# Patient Record
Sex: Male | Born: 1975 | Race: White | Hispanic: No | Marital: Married | Smoking: Never smoker
Health system: Southern US, Community
[De-identification: ages and names within clinical notes are randomized; demographics above are authoritative.]

## PROBLEM LIST (undated history)

## (undated) DIAGNOSIS — H539 Unspecified visual disturbance: Secondary | ICD-10-CM

## (undated) DIAGNOSIS — T7840XA Allergy, unspecified, initial encounter: Secondary | ICD-10-CM

## (undated) HISTORY — DX: Unspecified visual disturbance: H53.9

## (undated) HISTORY — PX: NO PAST SURGERIES: SHX2092

## (undated) HISTORY — PX: WISDOM TOOTH EXTRACTION: SHX21

## (undated) HISTORY — DX: Allergy, unspecified, initial encounter: T78.40XA

---

## 2016-05-26 ENCOUNTER — Ambulatory Visit (INDEPENDENT_AMBULATORY_CARE_PROVIDER_SITE_OTHER): Payer: Managed Care, Other (non HMO) | Admitting: Physician Assistant

## 2016-05-26 ENCOUNTER — Encounter: Payer: Self-pay | Admitting: Physician Assistant

## 2016-05-26 VITALS — BP 130/80 | HR 75 | Temp 98.2°F | Resp 14 | Ht 68.75 in | Wt 169.0 lb

## 2016-05-26 DIAGNOSIS — R52 Pain, unspecified: Secondary | ICD-10-CM

## 2016-05-26 DIAGNOSIS — M67911 Unspecified disorder of synovium and tendon, right shoulder: Secondary | ICD-10-CM | POA: Diagnosis not present

## 2016-05-26 DIAGNOSIS — Z0000 Encounter for general adult medical examination without abnormal findings: Secondary | ICD-10-CM | POA: Diagnosis not present

## 2016-05-26 DIAGNOSIS — R531 Weakness: Secondary | ICD-10-CM | POA: Insufficient documentation

## 2016-05-26 DIAGNOSIS — R202 Paresthesia of skin: Secondary | ICD-10-CM | POA: Diagnosis not present

## 2016-05-26 HISTORY — DX: Pain, unspecified: R52

## 2016-05-26 HISTORY — DX: Encounter for general adult medical examination without abnormal findings: Z00.00

## 2016-05-26 HISTORY — DX: Paresthesia of skin: R20.2

## 2016-05-26 HISTORY — DX: Unspecified disorder of synovium and tendon, right shoulder: M67.911

## 2016-05-26 HISTORY — DX: Weakness: R53.1

## 2016-05-26 LAB — COMPREHENSIVE METABOLIC PANEL
ALBUMIN: 4.5 g/dL (ref 3.5–5.2)
ALT: 32 U/L (ref 0–53)
AST: 23 U/L (ref 0–37)
Alkaline Phosphatase: 59 U/L (ref 39–117)
BUN: 15 mg/dL (ref 6–23)
CHLORIDE: 102 meq/L (ref 96–112)
CO2: 32 mEq/L (ref 19–32)
CREATININE: 1 mg/dL (ref 0.40–1.50)
Calcium: 9.7 mg/dL (ref 8.4–10.5)
GFR: 88.55 mL/min (ref >60.00)
GLUCOSE: 93 mg/dL (ref 70–99)
Potassium: 4.5 mEq/L (ref 3.5–5.1)
Sodium: 139 mEq/L (ref 135–145)
Total Bilirubin: 0.9 mg/dL (ref 0.2–1.2)
Total Protein: 7.5 g/dL (ref 6.0–8.3)

## 2016-05-26 LAB — LIPID PANEL
CHOL/HDL RATIO: 5 ratio — AB (ref <5.0)
Cholesterol: 208 mg/dL — ABNORMAL HIGH (ref <200)
HDL: 42 mg/dL (ref >40)
LDL CALC: 143 mg/dL — AB (ref <100)
Triglycerides: 116 mg/dL (ref <150)
VLDL: 23 mg/dL (ref <30)

## 2016-05-26 LAB — CBC
HCT: 47.3 % (ref 39.0–52.0)
HEMOGLOBIN: 15.7 g/dL (ref 13.0–17.0)
MCHC: 33.1 g/dL (ref 30.0–36.0)
MCV: 90.5 fl (ref 78.0–100.0)
PLATELETS: 323 10*3/uL (ref 150.0–400.0)
RBC: 5.23 Mil/uL (ref 4.22–5.81)
RDW: 13.4 % (ref 11.5–15.5)
WBC: 6 10*3/uL (ref 4.0–10.5)

## 2016-05-26 LAB — URINALYSIS, ROUTINE W REFLEX MICROSCOPIC
BILIRUBIN URINE: NEGATIVE
HGB URINE DIPSTICK: NEGATIVE
Ketones, ur: NEGATIVE
LEUKOCYTES UA: NEGATIVE
NITRITE: NEGATIVE
Specific Gravity, Urine: 1.015 (ref 1.000–1.030)
Total Protein, Urine: NEGATIVE
UROBILINOGEN UA: 0.2 (ref 0.0–1.0)
Urine Glucose: NEGATIVE
pH: 7.5 (ref 5.0–8.0)

## 2016-05-26 LAB — TSH: TSH: 1.42 u[IU]/mL (ref 0.35–4.50)

## 2016-05-26 LAB — SEDIMENTATION RATE: SED RATE: 7 mm/h (ref 0–15)

## 2016-05-26 LAB — HEMOGLOBIN A1C: HEMOGLOBIN A1C: 5.6 % (ref 4.6–6.5)

## 2016-05-26 MED ORDER — NAPROXEN SODIUM 550 MG PO TABS: 550.0000 mg | ORAL_TABLET | Freq: Two times a day (BID) | ORAL | 0 refills | Status: DC

## 2016-05-26 NOTE — Assessment & Plan Note (Signed)
Concern for mild tear. Supportive measures reviewed. Rx Anaprox. Referral to Sports Medicine placed.

## 2016-05-26 NOTE — Progress Notes (Signed)
Patient presents to clinic today to establish care. Patient is requesting CPE today. Is fasting for labs. 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 Tries to avoid fast food when possible. Diet well-balanced when at home. Exercise -- is playing softball presently. Is a golfer. Body mass index is 25.14 kg/m. Has gained 10 pounds since move to [employer].  Acute Concerns: Endorses long-standing history (> 10 years) of joint aches, specifically in the lower legs/ankles and feet bilaterally.Worsening over the past year. Aching most of the time, occasionally sharp/stabbing pains. Pain is worsened with ambulation.  Endorses significant, prolonged stiffness in the mornings. Also endorses stiffness/pain of hands and wrists bilaterally. Patient notes morning stiffness in the hands. Endorses occasional weakness in grip.  Was an athlete since a young age, playing contact sports. Still playing volleyball and softball.  Endorses R shoulder pain with decreased ROM secondary to pain. Denies weakness of shoulder. States this has been present for several months. Grandmother with rheumatoid arthritis.   Chronic Issues: Seasonal allergies -- Spring-time. Starts [place] during pollen season. Usually stops in early summer and does not require treatment the rest of the year.   Health Maintenance: Immunizations -- Unsure of Tetanus. Declines today.  Past Medical History:  Diagnosis Date  . Allergy    Seasonal    Past Surgical History:  Procedure Laterality Date  . NO PAST SURGERIES      No current outpatient prescriptions on file prior to visit.   No current facility-administered medications on file prior to visit.     No Known Allergies  Family History  Problem Relation Age of Onset  . Hypertension Mother   . Hyperlipidemia Mother   . Hyperlipidemia Father   . Hypertension Father   . Heart disease Father   . Hyperlipidemia Brother   . COPD Maternal Uncle   . Hypertension Maternal Uncle   .  Atrial fibrillation Maternal Uncle   . Hypertension Paternal Uncle   . Hyperlipidemia Paternal Uncle   . Hypertension Maternal Grandmother   . Cancer Maternal Grandmother     Uterus  . Dementia Maternal Grandmother   . Hypertension Maternal Grandfather   . Cancer Maternal Grandfather     Prostate, Lung, Brain  . Cancer Paternal Grandmother     Breast  . Heart attack Paternal Grandmother   . Cancer Paternal Grandfather     Leukemia    Social History   Social History  . Marital status: Married    Spouse name: Victorino Dike  . Number of children: 4  . Years of education: 4   Occupational History  . Sales     [employer]   Social History Main Topics  . Smoking status: Never Smoker  . Smokeless tobacco: Never Used  . Alcohol use No  . Drug use: No  . Sexual activity: Yes   Other Topics Concern  . Not on file   Social History Narrative  . No narrative on file   Review of Systems  Constitutional: Negative for fever and weight loss.  HENT: Negative for ear discharge, ear pain, hearing loss and tinnitus.   Eyes: Negative for blurred vision, double vision, photophobia and pain.  Respiratory: Negative for cough and shortness of breath.   Cardiovascular: Negative for chest pain and palpitations.  Gastrointestinal: Negative for abdominal pain, blood in stool, constipation, diarrhea, heartburn, melena, nausea and vomiting.  Genitourinary: Negative for dysuria, flank pain, frequency, hematuria and urgency.  Musculoskeletal: Positive for back pain, joint pain and  neck pain. Negative for falls.  Neurological: Negative for dizziness, loss of consciousness and headaches.  Endo/Heme/Allergies: Negative for environmental allergies.  Psychiatric/Behavioral: Negative for depression, hallucinations, substance abuse and suicidal ideas. The patient is not nervous/anxious and does not have insomnia.    BP 130/80   Pulse 75   Temp 98.2 F (36.8 C) (Oral)   Resp 14   Ht 5' 8.75" (1.746 m)    Wt 169 lb (76.7 kg)   SpO2 99%   BMI 25.14 kg/m   Physical Exam  Constitutional: He is oriented to person, place, and time and well-developed, well-nourished, and in no distress.  HENT:  Head: Normocephalic and atraumatic.  Right Ear: External ear normal.  Left Ear: External ear normal.  Nose: Nose normal.  Mouth/Throat: Oropharynx is clear and moist. No oropharyngeal exudate.  Eyes: Conjunctivae and EOM are normal. Pupils are equal, round, and reactive to light.  Neck: Neck supple. No thyromegaly present.  Cardiovascular: Normal rate, regular rhythm, normal heart sounds and intact distal pulses.   Pulmonary/Chest: Effort normal and breath sounds normal. No respiratory distress. He has no wheezes. He has no rales. He exhibits no tenderness.  Abdominal: Soft. Bowel sounds are normal. He exhibits no distension and no mass. There is no tenderness. There is no rebound and no guarding.  Genitourinary: Testes/scrotum normal and penis normal. No discharge found.  Musculoskeletal:       Right shoulder: He exhibits normal range of motion, no spasm, normal pulse and normal strength.       Right ankle: Normal.       Left ankle: Normal.       Cervical back: He exhibits tenderness. He exhibits normal range of motion and no bony tenderness.       Right lower leg: Normal.       Left lower leg: Normal.       Right foot: Normal.       Left foot: Normal.  Pain with abduction and external rotation of R shoulder. Strength 5/5.  Lymphadenopathy:    He has no cervical adenopathy.  Neurological: He is alert and oriented to person, place, and time.  Skin: Skin is warm and dry. No rash noted.  Psychiatric: Affect normal.  Vitals reviewed.  Assessment/Plan: No problem-specific Assessment & Plan notes found for this encounter.    Piedad Climes, PA-C

## 2016-05-26 NOTE — Progress Notes (Signed)
Pre visit review using our clinic review tool, if applicable. No additional management support is needed unless otherwise documented below in the visit note. 

## 2016-05-26 NOTE — Assessment & Plan Note (Signed)
Depression screen negative. Health Maintenance reviewed -- Unsure of tetanus. Declines today. Preventive schedule discussed and handout given in AVS. Will obtain fasting labs today.

## 2016-05-26 NOTE — Assessment & Plan Note (Signed)
Chronic. Multiple joints -- hands/wrists, ankles/feet, shoulders, neck. Likely at least partially OA. Needs further assessment. Will check ESR, RF and ANA today.

## 2016-05-26 NOTE — Assessment & Plan Note (Signed)
Intermittent. Endorses negative MRI cervical spine previously. Will refer to Neurology for further assessment and potential nerve studies vs. Repeat imaging.

## 2016-05-26 NOTE — Patient Instructions (Addendum)
Please go to the lab for blood work.   Our office will call you with your results unless you have chosen to receive results via MyChart.  If your blood work is normal we will follow-up each year for physicals and as scheduled for chronic medical problems.  If anything is abnormal we will treat accordingly and get you in for a follow-up.  Use the Anaprox as directed if needed for severe aches. Consider starting a Tumeric supplement.  You will receive a phone call from Sports Medicine for further assessment of your shoulder and ankles. You will also get a phone call from Neurology for assessment of these paresthesias in your hand. Again we may need a repeat MRI of your shoulder.

## 2016-05-27 LAB — ANTI-NUCLEAR AB-TITER (ANA TITER): ANA Titer 1: 1:40 {titer} — ABNORMAL HIGH

## 2016-05-27 LAB — ANA: Anti Nuclear Antibody(ANA): POSITIVE — AB

## 2016-05-27 LAB — RHEUMATOID FACTOR: Rhuematoid fact SerPl-aCnc: <14 IU/mL (ref <14)

## 2016-05-30 ENCOUNTER — Other Ambulatory Visit: Payer: Self-pay | Admitting: Physician Assistant

## 2016-05-30 DIAGNOSIS — R768 Other specified abnormal immunological findings in serum: Secondary | ICD-10-CM

## 2016-05-31 ENCOUNTER — Ambulatory Visit (INDEPENDENT_AMBULATORY_CARE_PROVIDER_SITE_OTHER): Payer: Managed Care, Other (non HMO) | Admitting: Sports Medicine

## 2016-05-31 ENCOUNTER — Ambulatory Visit (INDEPENDENT_AMBULATORY_CARE_PROVIDER_SITE_OTHER): Payer: Managed Care, Other (non HMO)

## 2016-05-31 ENCOUNTER — Encounter: Payer: Self-pay | Admitting: Sports Medicine

## 2016-05-31 VITALS — BP 120/82 | HR 72 | Ht 68.75 in | Wt 170.2 lb

## 2016-05-31 DIAGNOSIS — G8929 Other chronic pain: Secondary | ICD-10-CM | POA: Diagnosis not present

## 2016-05-31 DIAGNOSIS — M47812 Spondylosis without myelopathy or radiculopathy, cervical region: Secondary | ICD-10-CM

## 2016-05-31 DIAGNOSIS — R768 Other specified abnormal immunological findings in serum: Secondary | ICD-10-CM

## 2016-05-31 DIAGNOSIS — M25511 Pain in right shoulder: Secondary | ICD-10-CM

## 2016-05-31 DIAGNOSIS — M479 Spondylosis, unspecified: Secondary | ICD-10-CM | POA: Insufficient documentation

## 2016-05-31 MED ORDER — NAPROXEN-ESOMEPRAZOLE 500-20 MG PO TBEC: 1.0000 | DELAYED_RELEASE_TABLET | Freq: Two times a day (BID) | ORAL | 2 refills | Status: DC

## 2016-05-31 NOTE — Progress Notes (Signed)
OFFICE VISIT NOTE  D. , DO  [facility] [phone]  Zachary Garner - 40 y.o. male MRN [id number]  Date of birth: 07/15/1975  Visit Date: 05/31/2016  PCP: Martin, William Cody, PA-C   Referred by: Martin, William C, PA-C  SUBJECTIVE:   Chief Complaint  Patient presents with  . tendinopathy of right shoulder    Pt c/o right shoulder pain since playing volleyball in the pool this summer. He had shooting pain and couldn't feel his right hand after spiking the ball. Since then he hasn't been able to throw a softball and he has occasional tingling in the right arm/hand. The shoulder pain comes and goes and is worse when he is more active, doing things like lifting weights, playing softball. Pain when active is about 6/10.    HPI: As above. Additional pertinent information includes:  Symptoms as above.  Also has been having bilateral heel and Achilles pain as well as some left-sided arm pain.  No significant neck pain.  He plays softball 5 days per week but said modify his throwing disease no longer able to throw overhead without significant pain and weakness.  He recently had lab testing revealed an ANA of 1:40 with a nucleolar pattern.  He has been referred to rheumatology and is awaiting this appointment.  His ESR was normal.   ROS: ROS  Otherwise per HPI.  HISTORY & PERTINENT PRIOR DATA:  No specialty comments available. He reports that he has never smoked. He has never used smokeless tobacco.   Recent Labs  05/26/16 0927  HGBA1C 5.6   Medications & Allergies reviewed per EMR Patient Active Problem List   Diagnosis Date Noted  . Chronic right shoulder pain 05/31/2016  . Osteoarthritis of spine 05/31/2016  . Positive ANA (antinuclear antibody) 05/31/2016  . Visit for preventive health examination 05/26/2016  . Body aches 05/26/2016  . 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 Paresthesias with subjective  weakness 05/26/2016   Past Medical History:  Diagnosis Date  . Allergy    Seasonal   Family History  Problem Relation Age of Onset  . Hypertension Mother   . Hyperlipidemia Mother   . Hyperlipidemia Father   . Hypertension Father   . Heart disease Father     Triple Bypass  . Healthy Sister   . Hyperlipidemia Brother   . COPD Maternal Uncle   . Hypertension Maternal Uncle   . Atrial fibrillation Maternal Uncle   . Hypertension Paternal Uncle   . Hyperlipidemia Paternal Uncle   . Hypertension Maternal Grandmother   . Cancer Maternal Grandmother     Uterus  . Dementia Maternal Grandmother   . Hypertension Maternal Grandfather   . Cancer Maternal Grandfather     Prostate, Lung, Brain  . Cancer Paternal Grandmother     Breast  . Heart attack Paternal Grandmother   . Cancer Paternal Grandfather     Leukemia   Past Surgical History:  Procedure Laterality Date  . NO PAST SURGERIES     Social History   Occupational History  . Sales     [employer]   Social History Main Topics  . Smoking status: Never Smoker  . Smokeless tobacco: Never Used  . Alcohol use No  . Drug use: No  . Sexual activity: Yes    Partners: Female     Comment: wfe    OBJECTIVE:  VS:  HT:5' 8.75" (174.6 cm)   WT:170 lb 3.2   oz (77.2 kg)  BMI:25.4    BP:120/82  HR:72bpm  TEMP: ( )  RESP:99 % Physical Exam  Constitutional: He appears well-developed and well-nourished. He is cooperative.  Non-toxic appearance.  HENT:  Head: Normocephalic and atraumatic.  Cardiovascular: Intact distal pulses.   Pulmonary/Chest: No accessory muscle usage. No respiratory distress.  Neurological: He is alert. He is not disoriented. He displays normal reflexes. No sensory deficit.  Skin: Skin is warm, dry and intact. Capillary refill takes less than 2 seconds. No abrasion and no rash noted.  Psychiatric: He has a normal mood and affect. His speech is normal and behavior is normal. Thought content normal.    Right shoulder and arm: Overall good cervical range of motion.  Negative Spurling's compression test and arm squeeze test.  He has pain most focally with Yergason's testing and slightly reduced strength with speeds testing.  He has a positive crank test.  Negative apprehension test.  Upper extremity sensation is intact to light touch.  He does have crepitation with axial loading and circumduction and a positive clunk test   IMAGING & PROCEDURES: No results found. Findings:  Preliminary read of cervical spine films revealed degenerative change at C5-6 and C6-7 with anterior osteophytic changes. His right shoulder film is overall well aligned.  Well-maintained subacromial space.  Type I acromion.  He has no acute fracture or dislocation noted.  Questionable calcification along the labrum but unclear and poorly delineated.    ASSESSMENT & PLAN:  Visit Diagnoses:  1. Chronic right shoulder pain   2. Spondylosis of cervical region without myelopathy or radiculopathy   3. Positive ANA (antinuclear antibody)    Meds:  Meds ordered this encounter  Medications  . Naproxen-Esomeprazole (VIMOVO) 500-20 MG TBEC    Sig: Take 1 tablet by mouth 2 (two) times daily.    Dispense:  60 tablet    Refill:  2    Home Phone      [phone]     Orders:  Orders Placed This Encounter  Procedures  . DG Cervical Spine 2 or 3 views  . DG Shoulder Right    Follow-up: Return in about 6 weeks (around 07/12/2016).   Otherwise please see problem oriented charting as below. 

## 2016-05-31 NOTE — Assessment & Plan Note (Signed)
1:40 titer on recent blood work.  Has referral to rheumatology.  Will defer to their expertise for further evaluation.  Patient is also reporting some bilateral Achilles/heel pain -could consider ankylosing spondylitis.

## 2016-05-31 NOTE — Patient Instructions (Addendum)
Please perform the exercise program that Fayrene Fearing has prepared for you and gone over in detail on a daily basis.  In addition to the handout you were provided you can access your program through: [email or web address]   Your unique program code is: [email or web address]   Verbal order received for patient to receive sample medication. Patient teaching completed by provider.

## 2016-05-31 NOTE — Progress Notes (Signed)
PROCEDURE NOTE: THERAPEUTIC EXERCISES (97110) 15 minutes spent for Therapeutic exercises as stated in above notes.  This included exercises focusing on stretching, strengthening, with significant focus on eccentric aspects.   Proper technique shown and discussed handout in great detail with ATC.  All questions were discussed and answered.   

## 2016-05-31 NOTE — Assessment & Plan Note (Signed)
Degenerative change of the lower cervical segments however symptoms do not entirely correlate with cervical radiculitis.  I suspect this is more from brachial plexus irritation related to the shoulder injury.  If any lack of improvement can consider trial of gabapentin for both therapeutic and diagnostic purposes.

## 2016-05-31 NOTE — Assessment & Plan Note (Signed)
Suspect underlying labral tear.  Will begin with scapular stabilization exercises in 2 weeks of anti-inflammatories.  If any lack of improvement intra-articular injection will be pursued and he will call to schedule this.  Otherwise follow-up in 6 weeks for clinical recheck.

## 2016-06-16 ENCOUNTER — Ambulatory Visit
Admission: RE | Admit: 2016-06-16 | Discharge: 2016-06-16 | Disposition: A | Discharge status: Home / Self Care | Payer: Managed Care, Other (non HMO) | Source: Ambulatory Visit | Attending: Neurology | Admitting: Neurology

## 2016-06-16 ENCOUNTER — Encounter: Payer: Self-pay | Admitting: Neurology

## 2016-06-16 ENCOUNTER — Ambulatory Visit (INDEPENDENT_AMBULATORY_CARE_PROVIDER_SITE_OTHER): Payer: Managed Care, Other (non HMO) | Admitting: Neurology

## 2016-06-16 VITALS — BP 108/76 | HR 74 | Resp 14 | Ht 68.75 in | Wt 172.0 lb

## 2016-06-16 DIAGNOSIS — M25571 Pain in right ankle and joints of right foot: Secondary | ICD-10-CM | POA: Diagnosis not present

## 2016-06-16 DIAGNOSIS — G8929 Other chronic pain: Secondary | ICD-10-CM | POA: Diagnosis not present

## 2016-06-16 DIAGNOSIS — M25511 Pain in right shoulder: Secondary | ICD-10-CM

## 2016-06-16 DIAGNOSIS — M2550 Pain in unspecified joint: Secondary | ICD-10-CM

## 2016-06-16 DIAGNOSIS — M25572 Pain in left ankle and joints of left foot: Secondary | ICD-10-CM

## 2016-06-16 DIAGNOSIS — M542 Cervicalgia: Secondary | ICD-10-CM

## 2016-06-16 DIAGNOSIS — R202 Paresthesia of skin: Secondary | ICD-10-CM | POA: Diagnosis not present

## 2016-06-16 DIAGNOSIS — R531 Weakness: Secondary | ICD-10-CM | POA: Diagnosis not present

## 2016-06-16 HISTORY — DX: Cervicalgia: M54.2

## 2016-06-16 HISTORY — DX: Pain in unspecified joint: M25.50

## 2016-06-16 MED ORDER — PREDNISONE 10 MG (21) PO TBPK: ORAL_TABLET | ORAL | 0 refills | Status: DC

## 2016-06-16 NOTE — Progress Notes (Signed)
 [facility]  PATIENT: Zachary Garner DOB: 08/24/1975  REFERRING DOCTOR OR PCP:  William Martin, PA SOURCE:    _________________________________   HISTORICAL  CHIEF COMPLAINT:  Chief Complaint  Patient presents with  . Neck Pain    Zachary Garner is here for eval of neck pain onset about 10 yrs. ago, intermittent right shoulder pain/numbness that radiates to fingers. Intermittent left shoulder pain. Recent right shoulder injury while playing violleyball.  Has seen pcp for same and had x-rays of right shoulder and neck, also positive ANA, so has been referred to rheumatologist--he has a pending appt. in 2 weeks/fim    HISTORY OF PRESENT ILLNESS:  I had the pleasure of seeing your patient, Zachary Garner, at [facility] for neurologic consultation regarding his neck pain and right shoulder and arm pain.  He is a 40 yo man who has noted neck pain since his mid 20's.   He played a lot of sports in the past and continues to do so.   He always attributed the pain to his past.   He began to experience shoulder pain last summer and was  felt to have a torn labrum.   Recently he saw a PCP for the Garner time in many years to establish care and had additional imaging and lab tests.  I reviewed the laboratory tests and the ANA was borderline positive (1:40) with a nucleolar pattern. Other tests including ESR and RF were normal. Cholesterol and LDL were mildly elevated. An x-ray of the cervical spine 05/31/2016 showed minor degenerative changes with spurring at C5-C6 and C6-C7. X-ray of the right shoulder showed no major degenerative changes. There was possible calcific tendinitis.     Due to his symptoms and lab results he has been referred to Sports medicine, rheumatology and neurology.    He was recently started on Vimovo (500/20) bid.  He has not yet noted a benefi but has only taken 2-3 days.    He also reports ankle pain that bothers him when he walks and when he  is laying down after an active pain.   The ankle pain started several months ago and has been getting worse and is now the worse pain.    He had several sprained ankles in high school and college form volleyball and baseball.  He has never had a fracture.     He continues to be active playing sports many days a week.   He is doing less due to the pain and has gained 12 pounds.     He denies any rash. He has not noted swelling in any of his joints he denies any difficulty with gait, balance, strength or sensation. He has no bladder difficulties.   REVIEW OF SYSTEMS: Constitutional: No fevers, chills, sweats, or change in appetite Eyes: No visual changes, double vision, eye pain Ear, nose and throat: No hearing loss, ear pain, nasal congestion, sore throat Cardiovascular: No chest pain, palpitations Respiratory: No shortness of breath at rest or with exertion.   No wheezes GastrointestinaI: No nausea, vomiting, diarrhea, abdominal pain, fecal incontinence Genitourinary: No dysuria, urinary retention or frequency.  No nocturia. Musculoskeletal: as above Integumentary: No rash, pruritus, skin lesions Neurological: as above Psychiatric: No depression at this time.  No anxiety Endocrine: No palpitations, diaphoresis, change in appetite, change in weigh or increased thirst Hematologic/Lymphatic: No anemia, purpura, petechiae. Allergic/Immunologic: No itchy/runny eyes, nasal congestion, recent allergic reactions, rashes  ALLERGIES: No Known Allergies  HOME MEDICATIONS:  Current Outpatient   Prescriptions:  .  loratadine (ALAVERT) 10 MG tablet, Take 10 mg by mouth daily., Disp: , Rfl:   PAST MEDICAL HISTORY: Past Medical History:  Diagnosis Date  . Allergy    Seasonal  . Vision abnormalities     PAST SURGICAL HISTORY: Past Surgical History:  Procedure Laterality Date  . NO PAST SURGERIES      FAMILY HISTORY: Family History  Problem Relation Age of Onset  . Hypertension Mother    . Hyperlipidemia Mother   . Hyperlipidemia Father   . Hypertension Father   . Heart disease Father     Triple Bypass  . Healthy Sister   . Hyperlipidemia Brother   . COPD Maternal Uncle   . Hypertension Maternal Uncle   . Atrial fibrillation Maternal Uncle   . Hypertension Paternal Uncle   . Hyperlipidemia Paternal Uncle   . Hypertension Maternal Grandmother   . Cancer Maternal Grandmother     Uterus  . Dementia Maternal Grandmother   . Hypertension Maternal Grandfather   . Cancer Maternal Grandfather     Prostate, Lung, Brain  . Cancer Paternal Grandmother     Breast  . Heart attack Paternal Grandmother   . Cancer Paternal Grandfather     Leukemia    SOCIAL HISTORY:  Social History   Social History  . Marital status: Married    Spouse name: Zachary Garner  . Number of children: 4  . Years of education: 4   Occupational History  . Sales     [employer]   Social History Main Topics  . Smoking status: Never Smoker  . Smokeless tobacco: Never Used  . Alcohol use No  . Drug use: No  . Sexual activity: Yes    Partners: Female     Comment: wfe   Other Topics Concern  . Not on file   Social History Narrative  . No narrative on file     PHYSICAL EXAM  Vitals:   06/16/16 1034  BP: 108/76  Pulse: 74  Resp: 14  Weight: 172 lb (78 kg)  Height: 5' 8.75" (1.746 m)    Body mass index is 25.59 kg/m.   General: The patient is well-developed and well-nourished and in no acute distress  Eyes:  Funduscopic exam shows normal optic discs and retinal vessels.  Neck: The neck is supple, no carotid bruits are noted.  The neck is nontender.  Cardiovascular: The heart has a regular rate and rhythm with a normal S1 and S2. There were no murmurs, gallops or rubs. Lungs are clear to auscultation.  Skin: Extremities are without significant edema.  Musculoskeletal:  Back is nontender.   The shoulders are nontender but he has slight reduced range of motion on the right.    Ankles are nontender with good range of motion. Joints have a normal appearance in the hands and feet.  Neurologic Exam  Mental status: The patient is alert and oriented x 3 at the time of the examination. The patient has apparent normal recent and remote memory, with an apparently normal attention span and concentration ability.   Speech is normal.  Cranial nerves: Extraocular movements are full. Pupils are equal, round, and reactive to light and accomodation.  Visual fields are full.  Facial symmetry is present. There is good facial sensation to soft touch bilaterally.Facial strength is normal.  Trapezius and sternocleidomastoid strength is normal. No dysarthria is noted.  The tongue is midline, and the patient has symmetric elevation of the soft palate. No obvious  hearing deficits are noted.  Motor:  Muscle bulk is normal.   Tone is normal. Strength is  5 / 5 in all 4 extremities.   Sensory: Sensory testing is intact to pinprick, soft touch and vibration sensation in the arms. He had normal touch sensation but reduced vibration sensation at the toes.  Coordination: Cerebellar testing reveals good finger-nose-finger and heel-to-shin bilaterally.  Gait and station: Station is normal.   Gait is normal. Tandem gait is normal. Romberg is negative.   Reflexes: Deep tendon reflexes are symmetric and normal bilaterally in arms and ankles. At the knees, reflexes were mildly increased with mild spread.   Plantar responses are flexor.    DIAGNOSTIC DATA (LABS, IMAGING, TESTING) - I reviewed patient records, labs, notes, testing and imaging myself where available.  Lab Results  Component Value Date   WBC 6.0 05/26/2016   HGB 15.7 05/26/2016   HCT 47.3 05/26/2016   MCV 90.5 05/26/2016   PLT 323.0 05/26/2016      Component Value Date/Time   NA 139 05/26/2016 0927   K 4.5 05/26/2016 0927   CL 102 05/26/2016 0927   CO2 32 05/26/2016 0927   GLUCOSE 93 05/26/2016 0927   BUN 15 05/26/2016  0927   CREATININE 1.00 05/26/2016 0927   CALCIUM 9.7 05/26/2016 0927   PROT 7.5 05/26/2016 0927   ALBUMIN 4.5 05/26/2016 0927   AST 23 05/26/2016 0927   ALT 32 05/26/2016 0927   ALKPHOS 59 05/26/2016 0927   BILITOT 0.9 05/26/2016 0927   Lab Results  Component Value Date   CHOL 208 (H) 05/26/2016   HDL 42 05/26/2016   LDLCALC 143 (H) 05/26/2016   TRIG 116 05/26/2016   CHOLHDL 5.0 (H) 05/26/2016   Lab Results  Component Value Date   HGBA1C 5.6 05/26/2016   No results found for: VITAMINB12 Lab Results  Component Value Date   TSH 1.42 05/26/2016       ASSESSMENT AND PLAN  Multiple joint pain - Plan: Uric Acid, ANA w/Reflex  Paresthesias with subjective weakness - Plan: Vitamin B12  Chronic right shoulder pain  Neck pain  Chronic pain of both ankles - Plan: DG Ankle 2 Views Left, DG Ankle 2 Views Right   In summary, Mr. Nanna is a 41 year old man who has multiple joint pain that has progressed over the last few months. His exam today is normal except for mild reduced vibration sensation in the toes and increased reflexes at the knees. The worst pain is in the ankles. He has no history of injury there. We will check x-rays to determine if he has arthritic or other degenerative changes.   I will check a uric acid and recheck the ANA with reflex at a different lab (as previous reading was borderline increased titer).  He also has an appointment to see rheumatology later this month. Because of the mild reduced vibration in the toes and the hyperreflexia, we will check a vitamin B12. If the numbness worsens, consider nerve conduction study. He currently feels that the neck pain is mild and his shoulder pain is likely due to his injury last year. He is not experiencing pain further down the arm at this time. However, we will consider checking an MRI of the cervical spine if the symptoms worsen as he has known degenerative changes.  He will return to see me in 2-3 months or sooner  based on the results of studies.  Thank you for asking me to see Mr. Braver. Please  let me know if I can be of further assistance with him or other patients in the future.   Richard A. Sater, MD, PhD 06/16/2016, 10:36 AM Certified in Neurology, Clinical Neurophysiology, Sleep Medicine, Pain Medicine and Neuroimaging  [facility] [address], [address] [place], [place] [address] [phone]  ADDENDUM:   Ankle Xrays are normal.   RAS 

## 2016-06-17 LAB — URIC ACID: URIC ACID: 6.6 mg/dL (ref 3.7–8.6)

## 2016-06-17 LAB — ANA W/REFLEX: Anti Nuclear Antibody(ANA): NEGATIVE

## 2016-06-17 LAB — VITAMIN B12: Vitamin B-12: 525 pg/mL (ref 232–1245)

## 2016-06-20 ENCOUNTER — Telehealth: Payer: Self-pay | Admitting: *Deleted

## 2016-06-20 NOTE — Telephone Encounter (Signed)
LMOM that per RAS, ankle x-rays and labs done in our office, including ANA, were normal.  He does not need to return this call unless he has questions/fim

## 2016-06-20 NOTE — Telephone Encounter (Signed)
-----   Message from Asa Lenteichard A Sater, MD sent at 06/19/2016  5:27 PM EDT ----- Please let him know the ankle x-rays were normal.   The labwork was normal, including the ANA

## 2016-07-05 ENCOUNTER — Telehealth: Payer: Self-pay | Admitting: General Practice

## 2016-07-05 ENCOUNTER — Emergency Department (HOSPITAL_COMMUNITY)
Admission: EM | Admit: 2016-07-05 | Discharge: 2016-07-05 | Disposition: A | Discharge status: Home / Self Care | Payer: Managed Care, Other (non HMO) | Attending: Emergency Medicine | Admitting: Emergency Medicine

## 2016-07-05 ENCOUNTER — Encounter (HOSPITAL_COMMUNITY): Payer: Self-pay | Admitting: Emergency Medicine

## 2016-07-05 DIAGNOSIS — G510 Bell's palsy: Secondary | ICD-10-CM | POA: Diagnosis not present

## 2016-07-05 DIAGNOSIS — R29810 Facial weakness: Secondary | ICD-10-CM | POA: Diagnosis present

## 2016-07-05 DIAGNOSIS — Z79899 Other long term (current) drug therapy: Secondary | ICD-10-CM | POA: Diagnosis not present

## 2016-07-05 MED ORDER — ACYCLOVIR 400 MG PO TABS: 400.0000 mg | ORAL_TABLET | Freq: Every day | ORAL | 0 refills | Status: DC

## 2016-07-05 MED ORDER — HYPROMELLOSE (GONIOSCOPIC) 2.5 % OP SOLN: 1.0000 [drp] | OPHTHALMIC | 12 refills | Status: DC

## 2016-07-05 MED ORDER — PREDNISONE 20 MG PO TABS: 60.0000 mg | ORAL_TABLET | Freq: Every day | ORAL | 0 refills | Status: DC

## 2016-07-05 NOTE — ED Provider Notes (Signed)
[facility]-EMERGENCY DEPT Provider Note   CSN: [id number] Arrival date & time: 07/05/16  4098     History   Chief Complaint Chief Complaint  Patient presents with  . Otalgia  . Numbness    HPI Derell Bruun is a 41 y.o. male.  HPI   Patient is a 41 year old male with no parameters medical history presents the ED with complaint of facial numbness and weakness. Patient reports 3 days ago he began having a left earache. He notes yesterday evening when he was walking his dog he began to notice numbness to the left side of his face with associated weakness. Patient reports when he woke up this morning his symptoms continued resulting in him coming to the ED for evaluation. Denies fever, headache, lightheadedness, dizziness, visual changes, neck pain/stiffness, ear drainage, hearing loss, tinnitus, sore throat, rhinorrhea, facial swelling, slurred speech, cough, SOB, CP, abdominal pain, N/V, extremity numbness/weakness. Patient denies any medications prior to arrival.  Past Medical History:  Diagnosis Date  . Allergy    Seasonal  . Vision abnormalities     Patient Active Problem List   Diagnosis Date Noted  . Multiple joint pain 06/16/2016  . Neck pain 06/16/2016  . Bilateral ankle pain 06/16/2016  . Chronic right shoulder pain 05/31/2016  . Osteoarthritis of spine 05/31/2016  . Positive ANA (antinuclear antibody) 05/31/2016  . Visit for preventive health examination 05/26/2016  . Body aches 05/26/2016  . 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 Paresthesias with subjective weakness 05/26/2016    Past Surgical History:  Procedure Laterality Date  . NO PAST SURGERIES         Home Medications    Prior to Admission medications   Medication Sig Start Date End Date Taking? Authorizing Provider  loratadine (ALAVERT) 10 MG tablet Take 10 mg by mouth daily.   Yes [provider]  Naproxen-Esomeprazole (VIMOVO) 500-20 MG TBEC Take 1 tablet by mouth 2 (two) times  daily.   Yes [provider]  acyclovir (ZOVIRAX) 400 MG tablet Take 1 tablet (400 mg total) by mouth 5 (five) times daily. 07/05/16 07/12/16  Barrett Henle, PA-C  hydroxypropyl methylcellulose / hypromellose (ISOPTO TEARS / GONIOVISC) 2.5 % ophthalmic solution Place 1 drop into the left eye every hour while awake. 07/05/16   Barrett Henle, PA-C  predniSONE (DELTASONE) 20 MG tablet Take 3 tablets (60 mg total) by mouth daily. 07/05/16 07/12/16  Barrett Henle, PA-C    Family History Family History  Problem Relation Age of Onset  . Hypertension Mother   . Hyperlipidemia Mother   . Hyperlipidemia Father   . Hypertension Father   . Heart disease Father        Triple Bypass  . Healthy Sister   . Hyperlipidemia Brother   . COPD Maternal Uncle   . Hypertension Maternal Uncle   . Atrial fibrillation Maternal Uncle   . Hypertension Paternal Uncle   . Hyperlipidemia Paternal Uncle   . Hypertension Maternal Grandmother   . Cancer Maternal Grandmother        Uterus  . Dementia Maternal Grandmother   . Hypertension Maternal Grandfather   . Cancer Maternal Grandfather        Prostate, Lung, Brain  . Cancer Paternal Grandmother        Breast  . Heart attack Paternal Grandmother   . Cancer Paternal Grandfather        Leukemia    Social History Social History  Substance Use Topics  . Smoking  status: Never Smoker  . Smokeless tobacco: Never Used  . Alcohol use No     Allergies   Patient has no known allergies.   Review of Systems Review of Systems  HENT: Positive for ear pain (left).   Neurological: Positive for facial asymmetry (weakness) and numbness.  All other systems reviewed and are negative.    Physical Exam Updated Vital Signs BP (!) 140/94 (BP Location: Left Arm)   Pulse 70   Temp 97.8 F (36.6 C) (Oral)   Resp 16   SpO2 97%   Physical Exam  Constitutional: He is oriented to person, place, and time. He appears  well-developed and well-nourished. No distress.  HENT:  Head: Normocephalic and atraumatic.  Right Ear: Hearing and tympanic membrane normal.  Left Ear: Hearing and tympanic membrane normal.  Nose: Nose normal. Right sinus exhibits no maxillary sinus tenderness and no frontal sinus tenderness. Left sinus exhibits no maxillary sinus tenderness and no frontal sinus tenderness.  Mouth/Throat: Uvula is midline, oropharynx is clear and moist and mucous membranes are normal. No oropharyngeal exudate, posterior oropharyngeal edema, posterior oropharyngeal erythema or tonsillar abscesses. No tonsillar exudate.  Eyes: Conjunctivae and EOM are normal. Pupils are equal, round, and reactive to light. Right eye exhibits no discharge. Left eye exhibits no discharge. No scleral icterus.  Neck: Normal range of motion. Neck supple.  Cardiovascular: Normal rate, regular rhythm, normal heart sounds and intact distal pulses.   Pulmonary/Chest: Effort normal and breath sounds normal. No respiratory distress. He has no wheezes. He has no rales. He exhibits no tenderness.  Abdominal: Soft. Bowel sounds are normal. He exhibits no distension and no mass. There is no tenderness. There is no rebound and no guarding.  Musculoskeletal: Normal range of motion. He exhibits no edema or tenderness.  Neurological: He is alert and oriented to person, place, and time. A cranial nerve deficit is present. No sensory deficit. He displays a negative Romberg sign. Coordination and gait normal.  Left sided facial weakness including forehead, unable to wrinkle forehead or raise eyebrows, unable to raise corner of mouth, drooping of left angle of mouth. Minimal decreased closure of left eye.  Skin: Skin is warm and dry. He is not diaphoretic.  Nursing note and vitals reviewed.    ED Treatments / Results  Labs (all labs ordered are listed, but only abnormal results are displayed) Labs Reviewed - No data to display  EKG  EKG  Interpretation None       Radiology No results found.  Procedures Procedures (including critical care time)  Medications Ordered in ED Medications - No data to display   Initial Impression / Assessment and Plan / ED Course  I have reviewed the triage vital signs and the nursing notes.  Pertinent labs & imaging results that were available during my care of the patient were reviewed by me and considered in my medical decision making (see chart for details).     Patient presents with initial onset of left ear pain that started 3 days ago with new onset left-sided facial weakness and numbness that started last night. VSS. Exam revealed left sided facial weakness including forehead, facial sensation grossly intact. Remaining neuro exam unremarkable. Exam otherwise unremarkable. Presentation consistent with Bell's palsy. Plan to discharge patient home with artificial tears, prednisone and acyclovir. Advised patient to follow up with PCP for reevaluation and further management.  Final Clinical Impressions(s) / ED Diagnoses   Final diagnoses:  Bell's palsy    New Prescriptions New  Prescriptions   ACYCLOVIR (ZOVIRAX) 400 MG TABLET    Take 1 tablet (400 mg total) by mouth 5 (five) times daily.   HYDROXYPROPYL METHYLCELLULOSE / HYPROMELLOSE (ISOPTO TEARS / GONIOVISC) 2.5 % OPHTHALMIC SOLUTION    Place 1 drop into the left eye every hour while awake.   PREDNISONE (DELTASONE) 20 MG TABLET    Take 3 tablets (60 mg total) by mouth daily.     Barrett Henleadeau, Breiana Stratmann Elizabeth, PA-C 07/05/16 1054    Alvira MondaySchlossman, Erin, MD 07/06/16 [phone]

## 2016-07-05 NOTE — Discharge Instructions (Signed)
Take your medications as prescribed until completed. I recommend continuing to apply artificial tears to your left eye every hour while awake. I recommend wearing reading glasses and refraining from wearing contacts. Follow-up with your primary care provider within the next 45 days for follow-up evaluation and further management. Return to the emergency department if symptoms worsen or new onset of fever, headache, slurred speech, confusion, numbness, new weakness, visual changes, chest pain, difficulty breathing, abdominal pain, vomiting.

## 2016-07-05 NOTE — Telephone Encounter (Signed)
FYI, pt is at ER   [facility] - C TELEPHONE ADVICE RECORD [facility] Patient Name: Zachary HeinrichMATTHEW Garner Gender: Male DOB: Aug 15, 1975 Age: [age] Y 9 M 27 D Return Phone Number: [phone] (Primary) City/State/Zip: [place] Client [facility] [place] Night - C Client Site [facility] [place] - Night Physician AA - PHYSICIAN, Crissie FiguresUNKNOWN- MD Who Is Calling Patient / Member / Family / Caregiver Call Type Triage / Clinical Relationship To Patient Self Return Phone Number [phone] (Primary) Chief Complaint NUMBNESS/TINGLING- sudden on one side of the body or face Reason for Call Symptomatic / Request for Health Information Initial Comment Caller states, he has an ear ache and making left side of face numb. Nurse Assessment Nurse: Lane HackerHarley, RN, Elvin SoWindy Date/Time (Eastern Time): 07/05/2016 7:34:00 AM Confirm and document reason for call. If symptomatic, describe symptoms. ---Caller states that he has an earache since Friday or Saturday like shooting pains, and since yesterday making left side of face numb and weak. Does the PT have any chronic conditions? (i.e. diabetes, asthma, etc.) ---No Guidelines Guideline Title Affirmed Question Neurologic Deficit [1] Numbness (i.e., loss of sensation) of the face, arm / hand, or leg / foot on one side of the body AND [2] sudden onset AND [3] present now Disp. Time Lamount Cohen(Eastern Time) Disposition Final User 07/05/2016 7:38:55 AM Call EMS 911 Now Yes Lane HackerHarley, RN, [facility] Care Advice Given Per Guideline CALL EMS 911 NOW: Immediate medical attention is needed. You need to hang up and call 911 (or an ambulance). Probation officer(Triager Discretion: I'll call you back in a few minutes to be sure you were able to reach them.) CARE ADVICE given per Neurologic Deficit (Adult) guideline.

## 2016-07-05 NOTE — ED Triage Notes (Signed)
Pt sts left ear pain x 4 days; pt sts now having left sided facial numbness x 2 days

## 2016-07-12 ENCOUNTER — Encounter: Payer: Self-pay | Admitting: Sports Medicine

## 2016-07-12 ENCOUNTER — Ambulatory Visit (INDEPENDENT_AMBULATORY_CARE_PROVIDER_SITE_OTHER): Payer: Managed Care, Other (non HMO) | Admitting: Sports Medicine

## 2016-07-12 VITALS — BP 112/80 | HR 66 | Ht 68.75 in | Wt 170.4 lb

## 2016-07-12 DIAGNOSIS — M47812 Spondylosis without myelopathy or radiculopathy, cervical region: Secondary | ICD-10-CM | POA: Diagnosis not present

## 2016-07-12 DIAGNOSIS — M25511 Pain in right shoulder: Secondary | ICD-10-CM | POA: Diagnosis not present

## 2016-07-12 DIAGNOSIS — G8929 Other chronic pain: Secondary | ICD-10-CM

## 2016-07-12 DIAGNOSIS — R768 Other specified abnormal immunological findings in serum: Secondary | ICD-10-CM

## 2016-07-12 DIAGNOSIS — M67911 Unspecified disorder of synovium and tendon, right shoulder: Secondary | ICD-10-CM | POA: Diagnosis not present

## 2016-07-12 NOTE — Progress Notes (Signed)
OFFICE VISIT NOTE Zachary FellsMichael D. Zachary Shinerigby, DO  [facility] [place] [phone]  Zachary Garner - 41 y.o. male MRN [id number]  Date of birth: 1975-07-09  Visit Date: 07/12/2016  PCP: Zachary Garner, Zachary C, PA-Garner   Referred by: Zachary Garner, Zachary C, PA-Garner  Zachary DakinBrandy Garner, [name] acting as scribe for Zachary Garner.  SUBJECTIVE:   Chief Complaint  Patient presents with  . Follow-up    right shoulder pain   HPI: As below and per problem based documentation when appropriate.  Pt presents today in follow-up of chronic right shoulder pain Pain started last summer when playing volleyball in the pool.   Pt had xray done 05/31/16 which showed the following: IMPRESSION: There is no acute or significant chronic bony abnormality of the right shoulder. Possible calcific tendinitis. Given the chronic persistent symptoms, MRI would be a useful next imaging step.  Pt also had abnormal ANA 05/26/16 and has been referred to rheumatology.   Pt reports that pain in his right shoulder has resolved.  Therapies tried include : exercises provided at last office visit with Dr Zachary Garner.      Review of Systems  Constitutional: Negative for chills and fever.  Respiratory: Negative for shortness of breath and wheezing.   Cardiovascular: Negative for chest pain, palpitations and leg swelling.  Musculoskeletal: Negative for falls.  Neurological: Positive for tingling (Bells Palsy, dx last weekend). Negative for dizziness and headaches.  Endo/Heme/Allergies: Does not bruise/bleed easily.    Otherwise per HPI.  HISTORY & PERTINENT PRIOR DATA:  No specialty comments available. He reports that he has never smoked. He has never used smokeless tobacco.   Recent Labs  05/26/16 0927 06/16/16 1253  HGBA1C 5.6  --   LABURIC  --  6.6   Medications & Allergies reviewed per EMR Patient Active Problem List   Diagnosis Date Noted  . Multiple joint pain 06/16/2016  . Neck pain 06/16/2016    . Bilateral ankle pain 06/16/2016  . Chronic right shoulder pain 05/31/2016  . Osteoarthritis of spine 05/31/2016  . Positive ANA (antinuclear antibody) 05/31/2016  . Visit for preventive health examination 05/26/2016  . Body aches 05/26/2016  . 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 Paresthesias with subjective weakness 05/26/2016   Past Medical History:  Diagnosis Date  . Allergy    Seasonal  . Vision abnormalities    Family History  Problem Relation Age of Onset  . Hypertension Mother   . Hyperlipidemia Mother   . Hyperlipidemia Father   . Hypertension Father   . Heart disease Father        Triple Bypass  . Healthy Sister   . Hyperlipidemia Brother   . COPD Maternal Uncle   . Hypertension Maternal Uncle   . Atrial fibrillation Maternal Uncle   . Hypertension Paternal Uncle   . Hyperlipidemia Paternal Uncle   . Hypertension Maternal Grandmother   . Cancer Maternal Grandmother        Uterus  . Dementia Maternal Grandmother   . Hypertension Maternal Grandfather   . Cancer Maternal Grandfather        Prostate, Lung, Brain  . Cancer Paternal Grandmother        Breast  . Heart attack Paternal Grandmother   . Cancer Paternal Grandfather        Leukemia   Past Surgical History:  Procedure Laterality Date  . NO PAST SURGERIES     Social History   Occupational History  . Sales  [employer]   Social History Main Topics  . Smoking status: Never Smoker  . Smokeless tobacco: Never Used  . Alcohol use No  . Drug use: No  . Sexual activity: Yes    Partners: Female     Comment: wfe    OBJECTIVE:  VS:  HT:5' 8.75" (174.6 cm)   WT:170 lb 6.4 oz (77.3 kg)  BMI:25.4    BP:112/80  HR:66bpm  TEMP: ( )  RESP:99 % EXAM: Findings:  WDWN, NAD, Non-toxic appearing Alert & appropriately interactive Not depressed or anxious appearing No increased work of breathing. Pupils are equal. EOM intact without nystagmus No clubbing or cyanosis of the extremities  appreciated No significant rashes/lesions/ulcerations overlying the examined area. Radial pulses 2+/4.  No significant generalized UE edema. Sensation intact to light touch in upper extremities.    Neck and shoulders: Full overhead range of motion of bilateral shoulders.  Neck range of motion is unrestricted and symmetric. No significant pain with Spurling's compression tests or Lhermitte's compression test No pain with brachial plexus squeeze or arm squeeze test bilaterally. Upper extremity strength is 5/5 in all myotomes. No pain with empty can testing, speeds testing or O'Brien's testing.  Strength is 5/5 bilaterally and is symmetric with IR, ER, empty can testing, speeds. No pain or crepitation with axial loading and circumduction of the shoulder. No bony tenderness.       Dg Ankle 2 Views Left  Result Date: 06/16/2016 CLINICAL DATA:  Ankle pain for several months EXAM: LEFT ANKLE - 2 VIEW COMPARISON:  None. FINDINGS: There is no evidence of fracture, dislocation, or joint effusion. There is no evidence of arthropathy or other focal bone abnormality. Soft tissues are unremarkable. IMPRESSION: No acute abnormality noted. Electronically Signed   By: Zachary Garner M.D.   On: 06/16/2016 12:31   Dg Ankle 2 Views Right  Result Date: 06/16/2016 CLINICAL DATA:  Ankle pain EXAM: RIGHT ANKLE - 2 VIEW COMPARISON:  None. FINDINGS: There is no evidence of fracture, dislocation, or joint effusion. There is no evidence of arthropathy or other focal bone abnormality. Soft tissues are unremarkable. IMPRESSION: Negative. Electronically Signed   By: Zachary Garner M.D.   On: 06/16/2016 12:32   ASSESSMENT & PLAN:   Problem List Items Addressed This Visit    Tendinopathy of right shoulder    Overall this is seemingly significantly better.  He has essentially full strength that is pain-free.  Not concerned about any significant tear at this time.  If any recurrence of symptoms will consider further advanced  imaging.  Continue with scapular stabilization and rotator cuff exercises previously outlined.  Follow-up as needed.      Chronic right shoulder pain - Primary    He has had a recent diagnosis of Bell's palsy on top of all this and is on a steroid Dosepak again at this time but did have improvement prior to starting this to the point that he was able to return to normal activities.  He was able to throw ball over the weekend out of soccer game with his children without pain during or after.  I will plan to have him come back only if any symptoms recur.      Osteoarthritis of spine    Overall this is currently asymptomatic.  Continue postural exercises as previously outlined.  Reassuring findings from neurology appointment      Positive ANA (antinuclear antibody)    Patient has followed up with rheumatology and repeat labs were reported  to be normal per patient.         Follow-up: Return if symptoms worsen or fail to improve.   [name]/ATC served as Neurosurgeon during this visit. History, Physical, and Plan performed by medical provider. Documentation and orders reviewed and attested to.      Gaspar Bidding, DO    [facility]

## 2016-07-12 NOTE — Assessment & Plan Note (Signed)
He has had a recent diagnosis of Bell's palsy on top of all this and is on a steroid Dosepak again at this time but did have improvement prior to starting this to the point that he was able to return to normal activities.  He was able to throw ball over the weekend out of soccer game with his children without pain during or after.  I will plan to have him come back only if any symptoms recur.

## 2016-07-12 NOTE — Assessment & Plan Note (Signed)
Overall this is seemingly significantly better.  He has essentially full strength that is pain-free.  Not concerned about any significant tear at this time.  If any recurrence of symptoms will consider further advanced imaging.  Continue with scapular stabilization and rotator cuff exercises previously outlined.  Follow-up as needed.

## 2016-07-12 NOTE — Assessment & Plan Note (Signed)
Patient has followed up with rheumatology and repeat labs were reported to be normal per patient.

## 2016-07-12 NOTE — Assessment & Plan Note (Signed)
Overall this is currently asymptomatic.  Continue postural exercises as previously outlined.  Reassuring findings from neurology appointment

## 2016-07-13 ENCOUNTER — Telehealth: Payer: Self-pay | Admitting: *Deleted

## 2016-07-13 NOTE — Telephone Encounter (Signed)
LMTC.  He has an appt. with RAS on 08/12/16.  RAS is going to be out of the office that day.  I need to r/s his appt/fim

## 2016-08-12 ENCOUNTER — Ambulatory Visit: Payer: Managed Care, Other (non HMO) | Admitting: Neurology

## 2017-12-29 IMAGING — CR DG ANKLE 2V *R*
2 series · 2 of 2 positions shown · non-contrast
Comparison: None.

CLINICAL DATA: Ankle pain

EXAM:
RIGHT ANKLE - 2 VIEW

[x ankle ap right]
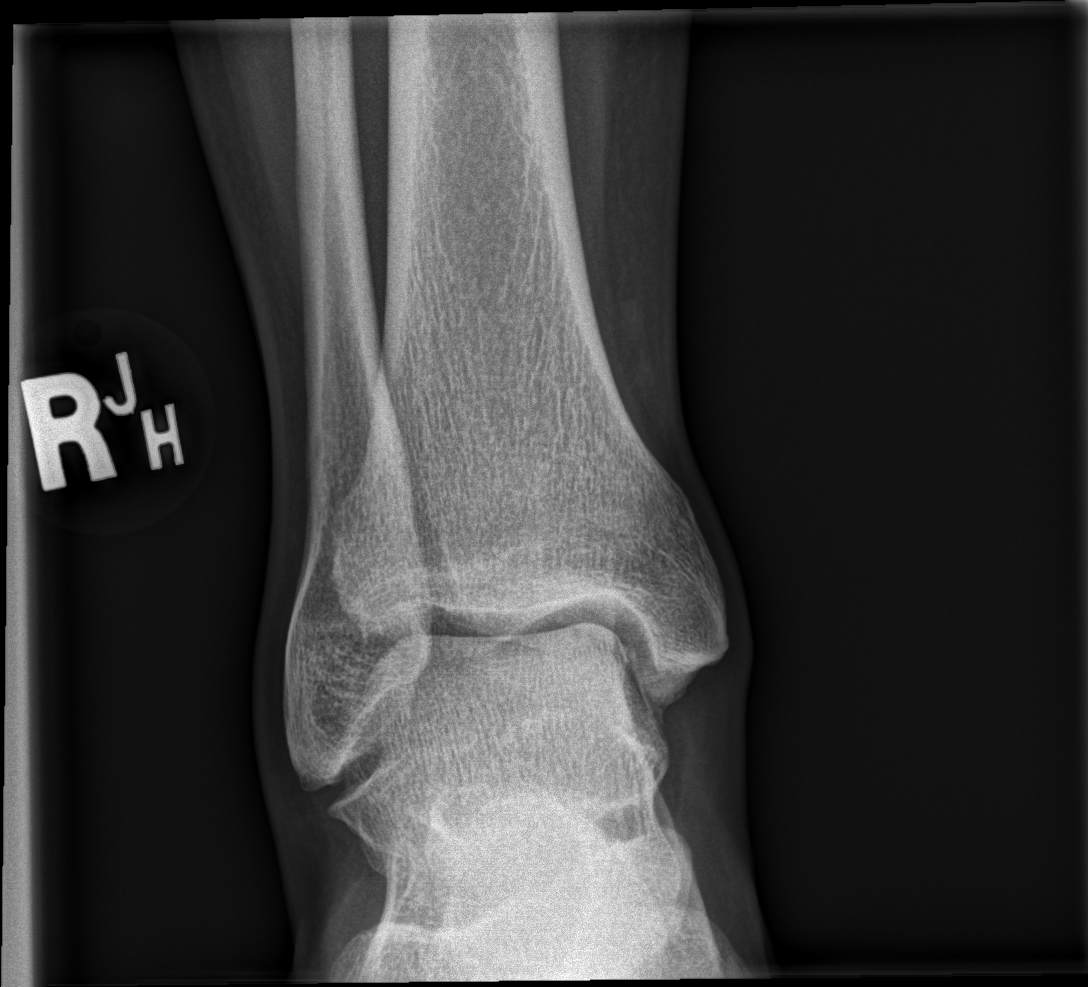

[x ankle lat right]
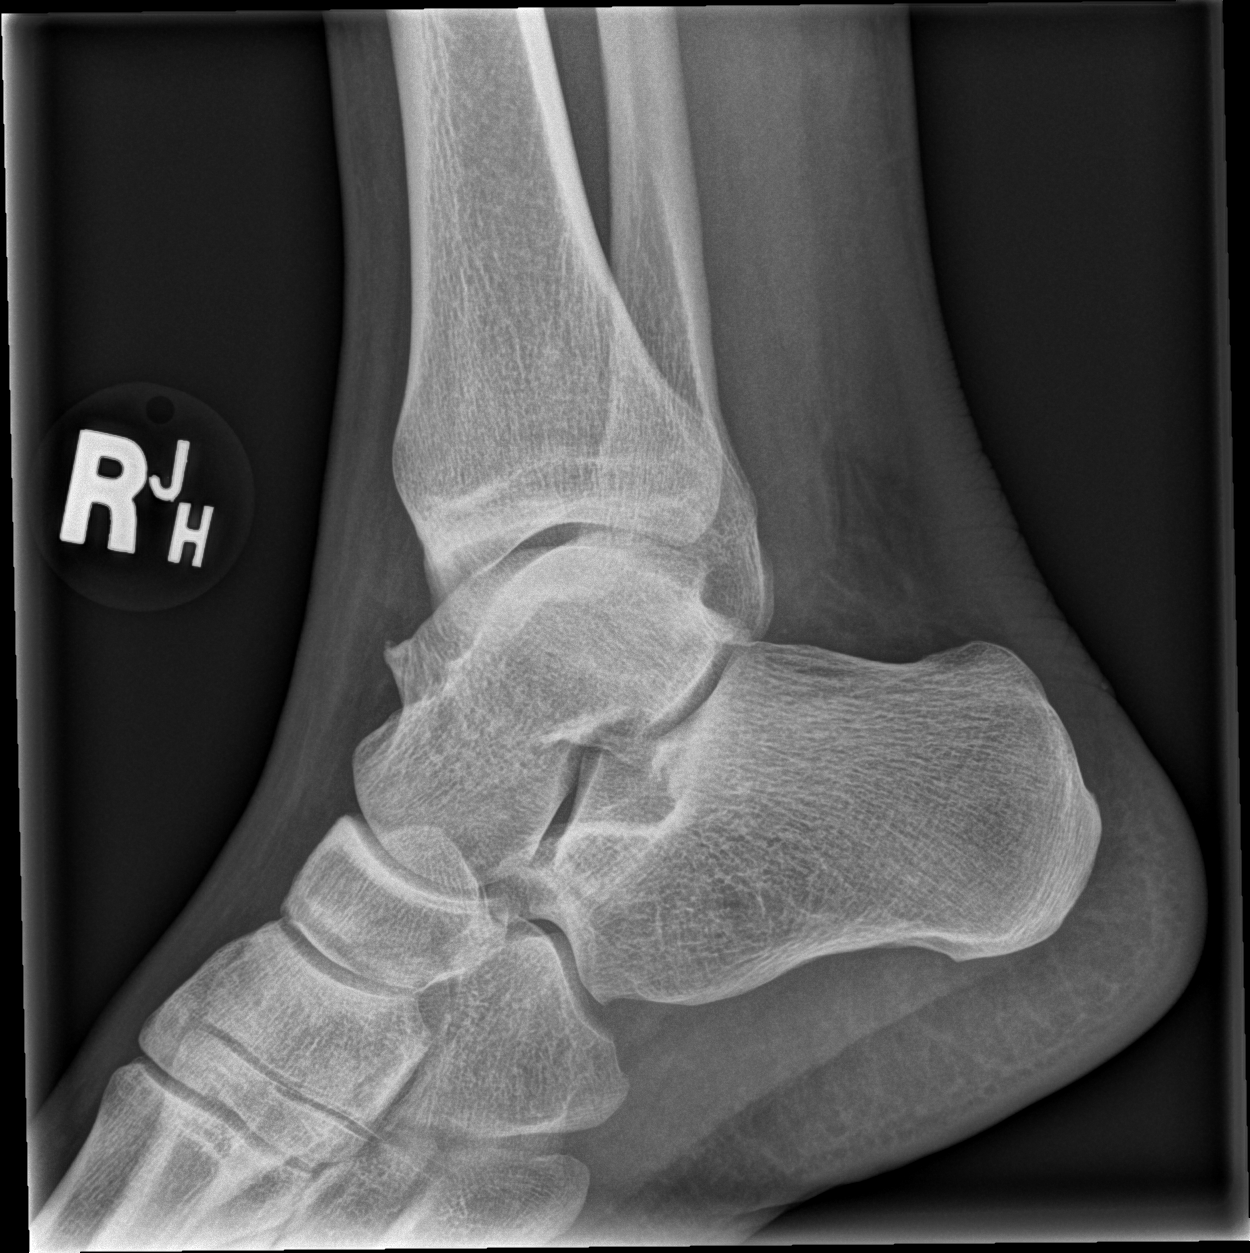

[2 of 2 positions shown; findings below may reference images not displayed]

FINDINGS: There is no evidence of fracture, dislocation, or joint effusion.
There is no evidence of arthropathy or other focal bone abnormality.
Soft tissues are unremarkable.
IMPRESSION: Negative.

## 2020-03-12 ENCOUNTER — Emergency Department (HOSPITAL_COMMUNITY): Payer: Medicaid Other

## 2020-03-12 ENCOUNTER — Emergency Department (HOSPITAL_COMMUNITY)
Admission: EM | Admit: 2020-03-12 | Discharge: 2020-03-12 | Disposition: A | Discharge status: Home / Self Care | Payer: Medicaid Other | Attending: Emergency Medicine | Admitting: Emergency Medicine

## 2020-03-12 ENCOUNTER — Encounter (HOSPITAL_COMMUNITY): Payer: Self-pay

## 2020-03-12 ENCOUNTER — Other Ambulatory Visit: Payer: Self-pay

## 2020-03-12 DIAGNOSIS — Y9323 Activity, snow (alpine) (downhill) skiing, snow boarding, sledding, tobogganing and snow tubing: Secondary | ICD-10-CM | POA: Diagnosis not present

## 2020-03-12 DIAGNOSIS — M25511 Pain in right shoulder: Secondary | ICD-10-CM

## 2020-03-12 DIAGNOSIS — S4991XA Unspecified injury of right shoulder and upper arm, initial encounter: Secondary | ICD-10-CM | POA: Insufficient documentation

## 2020-03-12 NOTE — Discharge Instructions (Addendum)
You likely have a rotator cuff tendinopathy or sprain.  You can take anti-inflammatories and Tylenol as described below, follow-up with your orthopedist in a week  You can take 600 mg of ibuprofen every 6 hours, you can take 1000 mg of Tylenol every 6 hours, you can alternate these every 3 or you can take them together.

## 2020-03-12 NOTE — ED Provider Notes (Signed)
[facility]-EMERGENCY DEPT Provider Note   CSN: [id number] Arrival date & time: 03/12/20  1637     History Chief Complaint  Patient presents with  . Shoulder Injury    Zachary Garner is a 45 y.o. male.  The history is provided by the patient.  Shoulder Injury This is a new problem. The current episode started more than 2 days ago. The problem occurs constantly. The problem has not changed since onset.Pertinent negatives include no chest pain, no abdominal pain, no headaches and no shortness of breath. Exacerbated by: rom and shoulder use. The symptoms are relieved by rest. He has tried nothing for the symptoms. The treatment provided no relief.       Past Medical History:  Diagnosis Date  . Allergy    Seasonal  . Vision abnormalities     Patient Active Problem List   Diagnosis Date Noted  . Multiple joint pain 06/16/2016  . Neck pain 06/16/2016  . Bilateral ankle pain 06/16/2016  . Chronic right shoulder pain 05/31/2016  . Osteoarthritis of spine 05/31/2016  . Positive ANA (antinuclear antibody) 05/31/2016  . Visit for preventive health examination 05/26/2016  . Body aches 05/26/2016  . 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 Paresthesias with subjective weakness 05/26/2016    Past Surgical History:  Procedure Laterality Date  . NO PAST SURGERIES         Family History  Problem Relation Age of Onset  . Hypertension Mother   . Hyperlipidemia Mother   . Hyperlipidemia Father   . Hypertension Father   . Heart disease Father        Triple Bypass  . Healthy Sister   . Hyperlipidemia Brother   . COPD Maternal Uncle   . Hypertension Maternal Uncle   . Atrial fibrillation Maternal Uncle   . Hypertension Paternal Uncle   . Hyperlipidemia Paternal Uncle   . Hypertension Maternal Grandmother   . Cancer Maternal Grandmother        Uterus  . Dementia Maternal Grandmother   . Hypertension Maternal Grandfather   . Cancer Maternal  Grandfather        Prostate, Lung, Brain  . Cancer Paternal Grandmother        Breast  . Heart attack Paternal Grandmother   . Cancer Paternal Grandfather        Leukemia    Social History   Tobacco Use  . Smoking status: Never Smoker  . Smokeless tobacco: Never Used  Vaping Use  . Vaping Use: Never used  Substance Use Topics  . Alcohol use: No  . Drug use: No    Home Medications Prior to Admission medications   Medication Sig Start Date End Date Taking? Authorizing Provider  Naproxen-Esomeprazole (VIMOVO) 500-20 MG TBEC Take 1 tablet by mouth 2 (two) times daily.    [provider]    Allergies    Patient has no known allergies.  Review of Systems   Review of Systems  Constitutional: Negative for chills and fever.  HENT: Negative for congestion and rhinorrhea.   Respiratory: Negative for cough and shortness of breath.   Cardiovascular: Negative for chest pain and palpitations.  Gastrointestinal: Negative for abdominal pain, diarrhea, nausea and vomiting.  Genitourinary: Negative for difficulty urinating and dysuria.  Musculoskeletal: Positive for arthralgias. Negative for back pain.  Skin: Negative for color change and rash.  Neurological: Negative for light-headedness and headaches.    Physical Exam Updated Vital Signs BP (!) 148/95 (BP Location: Right Arm)  Pulse 71   Temp 98.4 F (36.9 C) (Oral)   Resp 16   Ht 5' 8.75" (1.746 m)   Wt 78.9 kg   SpO2 98%   BMI 25.88 kg/m   Physical Exam Vitals and nursing note reviewed.  Constitutional:      General: He is not in acute distress.    Appearance: Normal appearance.  HENT:     Head: Normocephalic and atraumatic.     Nose: No rhinorrhea.  Eyes:     General:        Right eye: No discharge.        Left eye: No discharge.     Conjunctiva/sclera: Conjunctivae normal.  Cardiovascular:     Rate and Rhythm: Normal rate and regular rhythm.  Pulmonary:     Effort: Pulmonary effort is normal.      Breath sounds: No stridor.  Abdominal:     General: Abdomen is flat. There is no distension.     Palpations: Abdomen is soft.  Musculoskeletal:        General: Tenderness present. No swelling, deformity or signs of injury.     Comments: Neurovascular intact in both upper extremities, decreased range of motion with external rotation of the humerus AB duction beyond the shoulder, positive Neer's test,  Skin:    General: Skin is warm and dry.  Neurological:     General: No focal deficit present.     Mental Status: He is alert. Mental status is at baseline.     Motor: No weakness.  Psychiatric:        Mood and Affect: Mood normal.        Behavior: Behavior normal.        Thought Content: Thought content normal.     ED Results / Procedures / Treatments   Labs (all labs ordered are listed, but only abnormal results are displayed) Labs Reviewed - No data to display  EKG None  Radiology DG Shoulder Right  Result Date: 03/12/2020 CLINICAL DATA:  Right shoulder injury, fall EXAM: RIGHT SHOULDER - 2+ VIEW COMPARISON:  05/31/2016 FINDINGS: There is no evidence of fracture or dislocation. There is no evidence of arthropathy or other focal bone abnormality. Soft tissues are unremarkable. IMPRESSION: Negative. Electronically Signed   By: Charlett Nose M.D.   On: 03/12/2020 17:49    Procedures Procedures   Medications Ordered in ED Medications - No data to display  ED Course  I have reviewed the triage vital signs and the nursing notes.  Pertinent labs & imaging results that were available during my care of the patient were reviewed by me and considered in my medical decision making (see chart for details).    MDM Rules/Calculators/A&P                          45 year old male was in a skiing accident likely has rotator cuff tendinopathy.  Shoulder x-ray reviewed by myself and radiology shows no acute fracture or malalignment.  Patient likely has ligamentous injury.  Given outpatient  follow-up over-the-counter pain medication recommendations.  No other injury found reported. Final Clinical Impression(s) / ED Diagnoses Final diagnoses:  Acute pain of right shoulder    Rx / DC Orders ED Discharge Orders    None       Sabino Donovan, MD 03/12/20 2032

## 2020-03-12 NOTE — ED Triage Notes (Signed)
Patient reports that he fell while skiing and is now having right shoulder pain.

## 2021-05-24 ENCOUNTER — Other Ambulatory Visit: Payer: Self-pay

## 2021-05-24 ENCOUNTER — Emergency Department (HOSPITAL_BASED_OUTPATIENT_CLINIC_OR_DEPARTMENT_OTHER)
Admission: EM | Admit: 2021-05-24 | Discharge: 2021-05-25 | Disposition: A | Discharge status: Home / Self Care | Payer: Medicaid Other | Attending: Emergency Medicine | Admitting: Emergency Medicine

## 2021-05-24 ENCOUNTER — Encounter (HOSPITAL_BASED_OUTPATIENT_CLINIC_OR_DEPARTMENT_OTHER): Payer: Self-pay

## 2021-05-24 ENCOUNTER — Emergency Department (HOSPITAL_BASED_OUTPATIENT_CLINIC_OR_DEPARTMENT_OTHER): Payer: Medicaid Other

## 2021-05-24 DIAGNOSIS — M50323 Other cervical disc degeneration at C6-C7 level: Secondary | ICD-10-CM | POA: Diagnosis not present

## 2021-05-24 DIAGNOSIS — R200 Anesthesia of skin: Secondary | ICD-10-CM | POA: Diagnosis not present

## 2021-05-24 DIAGNOSIS — R202 Paresthesia of skin: Secondary | ICD-10-CM | POA: Diagnosis present

## 2021-05-24 DIAGNOSIS — R9431 Abnormal electrocardiogram [ECG] [EKG]: Secondary | ICD-10-CM | POA: Diagnosis not present

## 2021-05-24 DIAGNOSIS — M2578 Osteophyte, vertebrae: Secondary | ICD-10-CM | POA: Diagnosis not present

## 2021-05-24 DIAGNOSIS — M50322 Other cervical disc degeneration at C5-C6 level: Secondary | ICD-10-CM | POA: Diagnosis not present

## 2021-05-24 LAB — CBC WITH DIFFERENTIAL/PLATELET
Abs Immature Granulocytes: 0.03 10*3/uL (ref 0.00–0.07)
Basophils Absolute: 0.1 10*3/uL (ref 0.0–0.1)
Basophils Relative: 1 %
Eosinophils Absolute: 0.3 10*3/uL (ref 0.0–0.5)
Eosinophils Relative: 4 %
HCT: 42.3 % (ref 39.0–52.0)
Hemoglobin: 14.3 g/dL (ref 13.0–17.0)
Immature Granulocytes: 0 %
Lymphocytes Relative: 23 %
Lymphs Abs: 1.7 10*3/uL (ref 0.7–4.0)
MCH: 29.8 pg (ref 26.0–34.0)
MCHC: 33.8 g/dL (ref 30.0–36.0)
MCV: 88.1 fL (ref 80.0–100.0)
Monocytes Absolute: 0.8 10*3/uL (ref 0.1–1.0)
Monocytes Relative: 10 %
Neutro Abs: 4.8 10*3/uL (ref 1.7–7.7)
Neutrophils Relative %: 62 %
Platelets: 273 10*3/uL (ref 150–400)
RBC: 4.8 MIL/uL (ref 4.22–5.81)
RDW: 12.1 % (ref 11.5–15.5)
WBC: 7.6 10*3/uL (ref 4.0–10.5)
nRBC: 0 % (ref 0.0–0.2)

## 2021-05-24 LAB — BASIC METABOLIC PANEL
Anion gap: 18 — ABNORMAL HIGH (ref 5–15)
BUN: 20 mg/dL (ref 6–20)
CO2: 28 mmol/L (ref 22–32)
Calcium: 9.6 mg/dL (ref 8.9–10.3)
Chloride: 98 mmol/L (ref 98–111)
Creatinine, Ser: 1.01 mg/dL (ref 0.61–1.24)
GFR, Estimated: >60 mL/min (ref >60)
Glucose, Bld: 88 mg/dL (ref 70–99)
Potassium: 3.9 mmol/L (ref 3.5–5.1)
Sodium: 144 mmol/L (ref 135–145)

## 2021-05-24 LAB — TROPONIN I (HIGH SENSITIVITY): Troponin I (High Sensitivity): 4 ng/L (ref <18)

## 2021-05-24 MED ORDER — KETOROLAC TROMETHAMINE 15 MG/ML IJ SOLN
15.0000 mg | Freq: Once | INTRAMUSCULAR | Status: AC
  Administered 2021-05-24: 15 mg via INTRAVENOUS
  Filled 2021-05-24: qty 1

## 2021-05-24 NOTE — ED Notes (Signed)
MD made aware of add on trop ?

## 2021-05-24 NOTE — ED Provider Notes (Signed)
?[facility] EMERGENCY DEPT ?Provider Note ? ?CSN: [id number] ?Arrival date & time: 05/24/21 2153 ? ?Chief Complaint(s) ?Numbness ? ?HPI ?Zachary HeinrichMatthew Garner is a 46 y.o. male with a past medical history listed below who presents to the emergency department with 3 days of intermittent left arm numbness, tingling and deep pains.  Episodes would occur sporadically throughout the day.  At 3 PM this afternoon the pain became constant while playing golf.  There is no alleviating or aggravating factors.  The pain is fluctuating in nature but constant since 3 PM.  It is not exertional.  No associated chest pain or shortness of breath.  No nausea or vomiting.  No trauma. ? ?The history is provided by the patient.  ? ?Past Medical History ?Past Medical History:  ?Diagnosis Date  ? Allergy   ? Seasonal  ? Vision abnormalities   ? ?Patient Active Problem List  ? Diagnosis Date Noted  ? Multiple joint pain 06/16/2016  ? Neck pain 06/16/2016  ? Bilateral ankle pain 06/16/2016  ? Chronic right shoulder pain 05/31/2016  ? Osteoarthritis of spine 05/31/2016  ? Positive ANA (antinuclear antibody) 05/31/2016  ? Visit for preventive health examination 05/26/2016  ? Body aches 05/26/2016  ? Tendinopathy of right shoulder 05/26/2016  ? Paresthesias with subjective weakness 05/26/2016  ? ?Home Medication(s) ?Prior to Admission medications   ?Medication Sig Start Date End Date Taking? Authorizing Provider  ?Naproxen-Esomeprazole (VIMOVO) 500-20 MG TBEC Take 1 tablet by mouth 2 (two) times daily.    [provider]  ?                                                                                                                                  ?Allergies ?Patient has no known allergies. ? ?Review of Systems ?Review of Systems ?As noted in HPI ? ?Physical Exam ?Vital Signs  ?I have reviewed the triage vital signs ?BP (!) 144/94   Pulse 78   Temp 98.2 ?F (36.8 ?C)   Resp 17   Ht 5' 8.75" (1.746 m)   Wt 78.9 kg   SpO2  98%   BMI 25.87 kg/m?  ? ?Physical Exam ?Vitals reviewed.  ?Constitutional:   ?   General: He is not in acute distress. ?   Appearance: He is well-developed. He is not diaphoretic.  ?HENT:  ?   Head: Normocephalic and atraumatic.  ?   Nose: Nose normal.  ?Eyes:  ?   General: No scleral icterus.    ?   Right eye: No discharge.     ?   Left eye: No discharge.  ?   Conjunctiva/sclera: Conjunctivae normal.  ?   Pupils: Pupils are equal, round, and reactive to light.  ?Cardiovascular:  ?   Rate and Rhythm: Normal rate and regular rhythm.  ?   Heart sounds: No murmur heard. ?  No friction rub. No gallop.  ?Pulmonary:  ?   Effort: Pulmonary  effort is normal. No respiratory distress.  ?   Breath sounds: Normal breath sounds. No stridor. No rales.  ?Abdominal:  ?   General: There is no distension.  ?   Palpations: Abdomen is soft.  ?   Tenderness: There is no abdominal tenderness.  ?Musculoskeletal:     ?   General: No tenderness.  ?   Right shoulder: No tenderness.  ?   Left shoulder: No tenderness.  ?   Right wrist: No tenderness. Normal range of motion. Normal pulse.  ?   Left wrist: No tenderness. Normal range of motion. Normal pulse.  ?   Right hand: Normal strength. Normal sensation.  ?   Left hand: Normal strength. Normal sensation.  ?   Cervical back: Normal range of motion and neck supple. No spasms, tenderness or bony tenderness.  ?   Thoracic back: No spasms, tenderness or bony tenderness.  ?   Comments: 5/5 symmetric strength to BUE  ?Skin: ?   General: Skin is warm and dry.  ?   Findings: No erythema or rash.  ?Neurological:  ?   Mental Status: He is alert and oriented to person, place, and time.  ? ? ?ED Results and Treatments ?Labs ?(all labs ordered are listed, but only abnormal results are displayed) ?Labs Reviewed  ?BASIC METABOLIC PANEL - Abnormal; Notable for the following components:  ?    Result Value  ? Anion gap 18 (*)   ? All other components within normal limits  ?CBC WITH DIFFERENTIAL/PLATELET   ?TROPONIN I (HIGH SENSITIVITY)  ?                                                                                                                       ?EKG ? EKG Interpretation ? ?Date/Time:  Monday May 24 2021 22:13:25 EDT ?Ventricular Rate:  78 ?PR Interval:  174 ?QRS Duration: 84 ?QT Interval:  362 ?QTC Calculation: 412 ?R Axis:   68 ?Text Interpretation: Normal sinus rhythm Normal ECG No previous ECGs available Confirmed by Drema Pry [phone]) on 05/24/2021 11:24:32 PM ?  ? ?  ? ?Radiology ?CT Head Wo Contrast ? ?Result Date: 05/24/2021 ?CLINICAL DATA:  Left arm numbness. EXAM: CT HEAD WITHOUT CONTRAST TECHNIQUE: Contiguous axial images were obtained from the base of the skull through the vertex without intravenous contrast. RADIATION DOSE REDUCTION: This exam was performed according to the departmental dose-optimization program which includes automated exposure control, adjustment of the mA and/or kV according to patient size and/or use of iterative reconstruction technique. COMPARISON:  None. FINDINGS: Brain: No intracranial hemorrhage, mass effect, or midline shift. No hydrocephalus. The basilar cisterns are patent. No evidence of territorial infarct or acute ischemia. No extra-axial or intracranial fluid collection. Vascular: No hyperdense vessel or unexpected calcification. Skull: No fracture or focal lesion. Sinuses/Orbits: Paranasal sinuses and mastoid air cells are clear. The visualized orbits are unremarkable. Other: None. IMPRESSION: Negative noncontrast head CT. Electronically Signed   By: Ivette Loyal.D.  On: 05/24/2021 23:08  ? ?CT Cervical Spine Wo Contrast ? ?Result Date: 05/24/2021 ?CLINICAL DATA:  Left arm numbness for 3 days. EXAM: CT CERVICAL SPINE WITHOUT CONTRAST TECHNIQUE: Multidetector CT imaging of the cervical spine was performed without intravenous contrast. Multiplanar CT image reconstructions were also generated. RADIATION DOSE REDUCTION: This exam was performed according  to the departmental dose-optimization program which includes automated exposure control, adjustment of the mA and/or kV according to patient size and/or use of iterative reconstruction technique. COMPARISON:  None. FINDINGS: Alignment: Reversal of normal lordosis centered at C5-C6. No listhesis. Skull base and vertebrae: No acute fracture. No primary bone lesion or focal pathologic process. Soft tissues and spinal canal: No prevertebral fluid or swelling. No visible canal hematoma. Disc levels: Disc space narrowing and spurring at C5-C6 and C6-C7. Small posterior osteophytes at C6 level causes mild mass effect the spinal canal. There is no high-grade bony neural foraminal narrowing. Upper chest: No acute or unexpected findings. Other: None. IMPRESSION: Mild degenerative disc disease at C5-C6 and C6-C7. Small posterior osteophytes at C6 level causes mild mass effect the spinal canal. No high-grade bony neural foraminal narrowing. Electronically Signed   By: Narda Rutherford M.D.   On: 05/24/2021 23:11   ? ?Pertinent labs & imaging results that were available during my care of the patient were reviewed by me and considered in my medical decision making (see MDM for details). ? ?Medications Ordered in ED ?Medications  ?ketorolac (TORADOL) 15 MG/ML injection 15 mg (15 mg Intravenous Given 05/24/21 2342)  ?                                                               ?                                                                    ?Procedures ?Procedures ? ?(including critical care time) ? ?Medical Decision Making / ED Course ? ? ? Complexity of Problem: ? ?Co-morbidities/SDOH that complicate the patient evaluation/care: ?N/a ? ?Additional history obtained: ?N/a ? ?Patient's presenting problem/concern, DDX, and MDM listed below: ?Left arm paresthesia ?Cervical radiculopathy, peripheral nerve impingement. ?Less suspicious for CVA. ?Atypical for ACS but will rule out with EKG and single troponin given the duration of  symptoms. ? ?Hospitalization Considered:  ?Yes if ruled in for ACS ? ?Initial Intervention:  ?Toradol ? ?  Complexity of Data: ?  ?Cardiac Monitoring: ?The patient was maintained on a cardiac monitor.   ?I persona

## 2021-05-24 NOTE — ED Notes (Signed)
Patient transported to CT 

## 2021-05-24 NOTE — ED Triage Notes (Signed)
Patient here POV from Home. ? ?Endorses Left Arm Numbness for approximately 3 days. Numbness was initially intermittent but has become constant today.  ? ?Became cumbersome for Patient to play Golf due to Sensation.  ? ?FAST Exam Negative in Triage. ? ?NAD Noted during Triage. A&Ox4. GCS 15. Ambulatory. ?

## 2021-10-13 DIAGNOSIS — L821 Other seborrheic keratosis: Secondary | ICD-10-CM | POA: Diagnosis not present

## 2022-05-06 ENCOUNTER — Encounter (HOSPITAL_BASED_OUTPATIENT_CLINIC_OR_DEPARTMENT_OTHER): Payer: Self-pay

## 2022-05-06 ENCOUNTER — Other Ambulatory Visit: Payer: Self-pay

## 2022-05-06 ENCOUNTER — Emergency Department (HOSPITAL_BASED_OUTPATIENT_CLINIC_OR_DEPARTMENT_OTHER)
Admission: EM | Admit: 2022-05-06 | Discharge: 2022-05-06 | Disposition: A | Discharge status: Home / Self Care | Payer: Medicaid Other | Attending: Emergency Medicine | Admitting: Emergency Medicine

## 2022-05-06 DIAGNOSIS — L237 Allergic contact dermatitis due to plants, except food: Secondary | ICD-10-CM

## 2022-05-06 DIAGNOSIS — R21 Rash and other nonspecific skin eruption: Secondary | ICD-10-CM | POA: Diagnosis present

## 2022-05-06 MED ORDER — PREDNISONE 10 MG (21) PO TBPK: ORAL_TABLET | Freq: Every day | ORAL | 0 refills | Status: DC

## 2022-05-06 NOTE — ED Triage Notes (Signed)
POV from home, A&O x 4, GCS 15, amb to room  Sts he was cutting bushes on Monday, generalized itchy rash since then.

## 2022-05-06 NOTE — ED Provider Notes (Signed)
[facility] Provider Note   CSN: [id number] Arrival date & time: 05/06/22  2046     History  Chief Complaint  Patient presents with   Rash    Zachary Garner is a 47 y.o. male presented for rash that began yesterday.  Patient was cutting bushes yesterday and has had an itchy rash on his legs and arms that has turned into blisters with some that have popped.  Patient states itching has been extreme and they cannot sleep.  Patient has tried antihistamine spray and calamine lotion but still endorses symptoms.  Patient states he has rashes around his extremities outside of where he had his boots and gloves.  Patient denies chest pain, shortness of breath, fevers/chills, nausea/vomiting, widespread erythema, pain with rash, new medications  Home Medications Prior to Admission medications   Medication Sig Start Date End Date Taking? Authorizing Provider  predniSONE (STERAPRED UNI-PAK 21 TAB) 10 MG (21) TBPK tablet Take by mouth daily. Take 6 tabs by mouth daily  for 2 days, then 5 tabs for 2 days, then 4 tabs for 2 days, then 3 tabs for 2 days, 2 tabs for 2 days, then 1 tab by mouth daily for 2 days 05/06/22  Yes Zachary Garner  Naproxen-Esomeprazole (VIMOVO) 500-20 MG TBEC Take 1 tablet by mouth 2 (two) times daily.    Provider, Historical, Garner      Allergies    Patient has no known allergies.    Review of Systems   Review of Systems  Skin:  Positive for rash.  See HPI  Physical Exam Updated Vital Signs BP (!) 144/83   Pulse 79   Temp 98 F (36.7 C) (Oral)   Resp 18   Ht 5\' 9"  (1.753 m)   Wt 75.3 kg   SpO2 99%   BMI 24.51 kg/m  Physical Exam Constitutional:      General: He is not in acute distress. Cardiovascular:     Pulses: Normal pulses.     Comments: 2+ bilateral radial/dorsalis pedis pulses with regular rate Musculoskeletal:        General: Normal range of motion.  Skin:    General: Skin is warm and dry.      Capillary Refill: Capillary refill takes less than 2 seconds.     Findings: Rash present. Rash is purpuric and vesicular.     Comments: Rash noted on both lower and upper extremities with vesicles and with some draining clear fluid No surrounding erythema or gangrene noted  Neurological:     Mental Status: He is alert.     Comments: Sensation intact in all 4 limbs     ED Results / Procedures / Treatments   Labs (all labs ordered are listed, but only abnormal results are displayed) Labs Reviewed - No data to display  EKG None  Radiology No results found.  Procedures Procedures    Medications Ordered in ED Medications - No data to display  ED Course/ Medical Decision Making/ A&P                             Medical Decision Making  Zachary Garner 47 y.o. presented today for rash. Working DDx that I considered at this time includes, but not limited to, Stevens-Johnson/TENS, necrotizing fasciitis, dress, contact dermatitis.  R/o DDx: Stevens-Johnson/TENS, necrotizing fasciitis, dress: These are considered less likely due to history of present illness and physical exam findings  Review of prior external notes: 05/24/2021 ED  Unique Tests and My Interpretation: None  Discussion with Independent Historian: None  Discussion of Management of Tests: None  Risk:    Medium:  - prescription drug management  Risk Stratification Score: None  Plan: Patient presented for rash. On exam patient was in no distress and stable vitals.  On exam patient had vesicles on upper and lower extremities with some vesicles draining.  Given patient's history and physical exam this is most likely poison ivy and I spoke to the patient about taking Benadryl at night to help sleep and Claritin or Zyrtec in the morning for symptoms.  Due to the rashes extent I do not believe cream will be sufficient and patient will be given steroid burst.  Educated patient on following up with primary care provider  in the next few days to be reevaluated and to monitor symptoms.  Patient was given return precautions. Patient stable for discharge at this time.  Patient verbalized understanding of plan.         Final Clinical Impression(s) / ED Diagnoses Final diagnoses:  Poison ivy    Rx / DC Orders ED Discharge Orders          Ordered    predniSONE (STERAPRED UNI-PAK 21 TAB) 10 MG (21) TBPK tablet  Daily        05/06/22 2140              Zachary Garner 05/06/22 2204    Zachary Garner 05/06/22 2303

## 2022-05-06 NOTE — ED Notes (Signed)
Patient verbalizes understanding of discharge instructions. Opportunity for questioning and answers were provided. Armband removed by staff, pt discharged from ED. Ambulated out to lobby  

## 2022-05-06 NOTE — Discharge Instructions (Addendum)
Please pick up the steroids I have prescribed for you and take as prescribed.  You may take Benadryl at night to help with sleeping and take Claritin or Zyrtec in the morning to help with symptoms.  Please follow-up with your primary care provider in the next few days to be reevaluated.  I have attached a work note for the next week for you to use a your discretion.  If symptoms worsen please return to ER.

## 2022-08-30 DIAGNOSIS — B86 Scabies: Secondary | ICD-10-CM | POA: Diagnosis not present

## 2022-09-27 DIAGNOSIS — B86 Scabies: Secondary | ICD-10-CM | POA: Diagnosis not present

## 2022-09-27 DIAGNOSIS — D485 Neoplasm of uncertain behavior of skin: Secondary | ICD-10-CM | POA: Diagnosis not present

## 2022-10-07 DIAGNOSIS — Z6826 Body mass index (BMI) 26.0-26.9, adult: Secondary | ICD-10-CM | POA: Diagnosis not present

## 2022-10-07 DIAGNOSIS — E663 Overweight: Secondary | ICD-10-CM | POA: Diagnosis not present

## 2022-10-07 DIAGNOSIS — S90852A Superficial foreign body, left foot, initial encounter: Secondary | ICD-10-CM | POA: Diagnosis not present

## 2022-12-06 IMAGING — CT CT CERVICAL SPINE W/O CM
3 of 4 series · 10 of 33 positions shown, 12 images · non-contrast
Comparison: None.

CLINICAL DATA: Left arm numbness for 3 days.



[Series 5: cor bone · coronal · 0.30mm/px · 3 of 53 slices shown]
[im 11/53  bone]
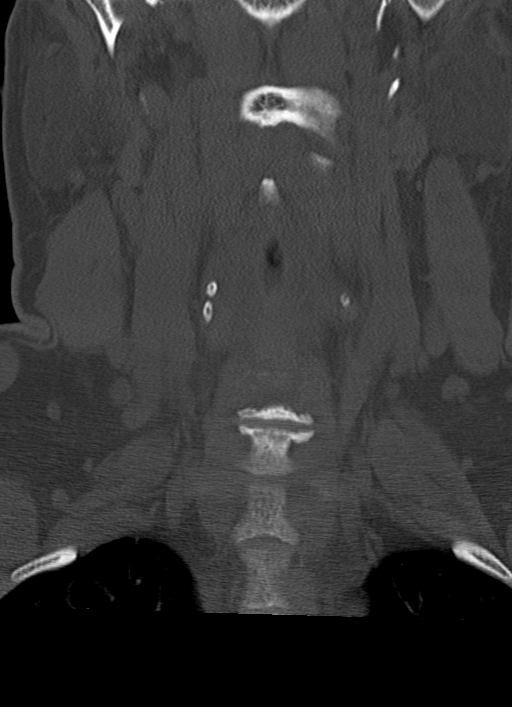
[im 21/53  bone]
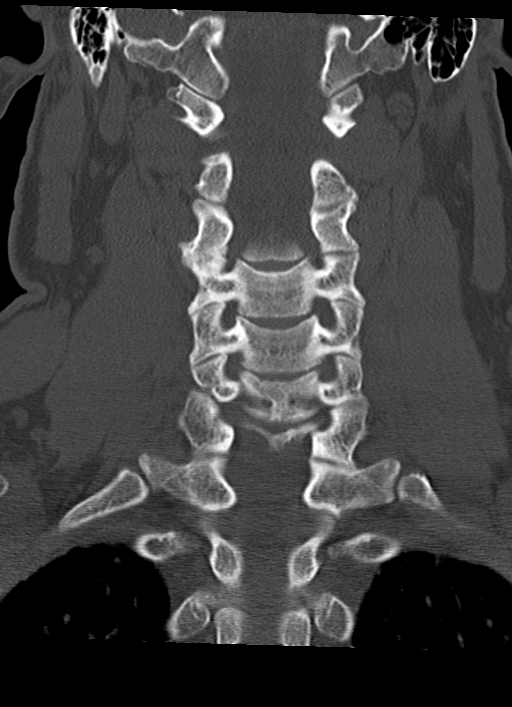
[im 32/53  bone]
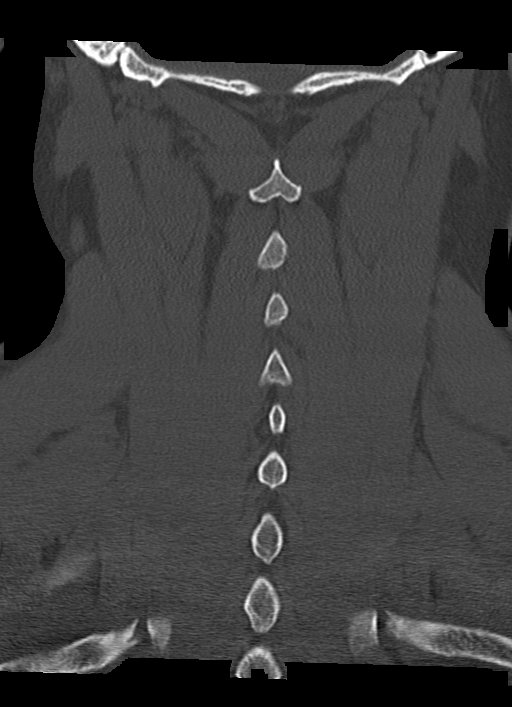

[Series 6: sag bone · sagittal · 0.21mm/px · 5 of 78 slices shown, 6 images]
[im 26/78  bone]
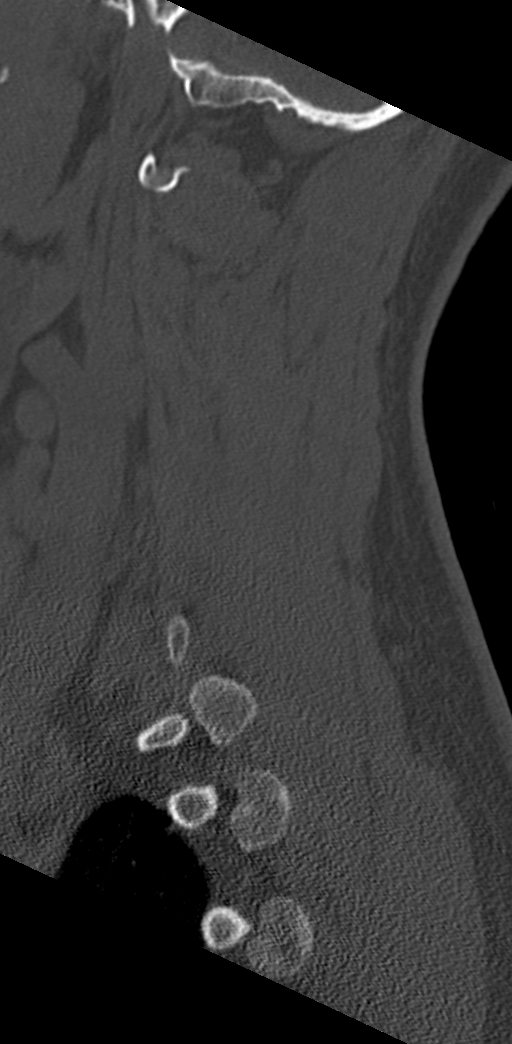
[im 33/78  bone]
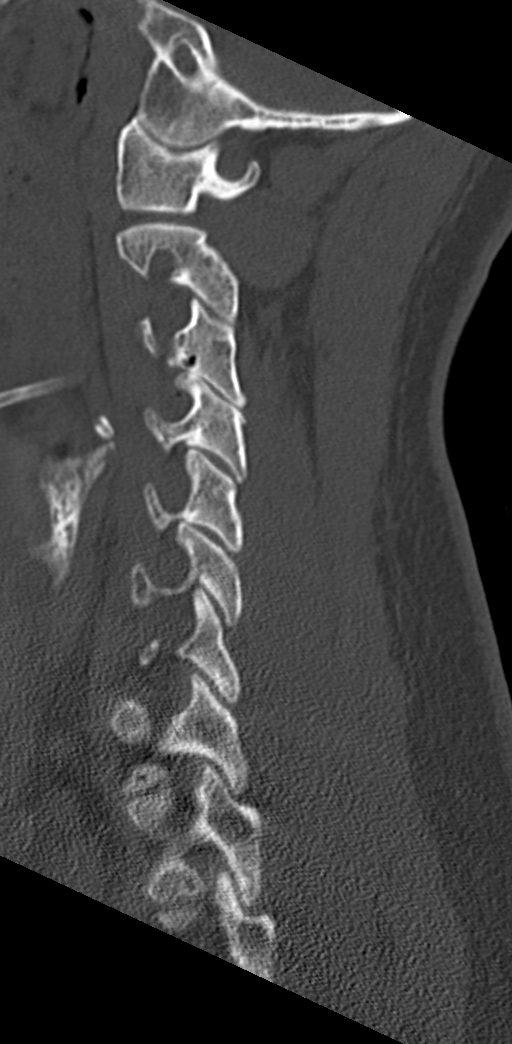
[im 39/78  soft-tissue]
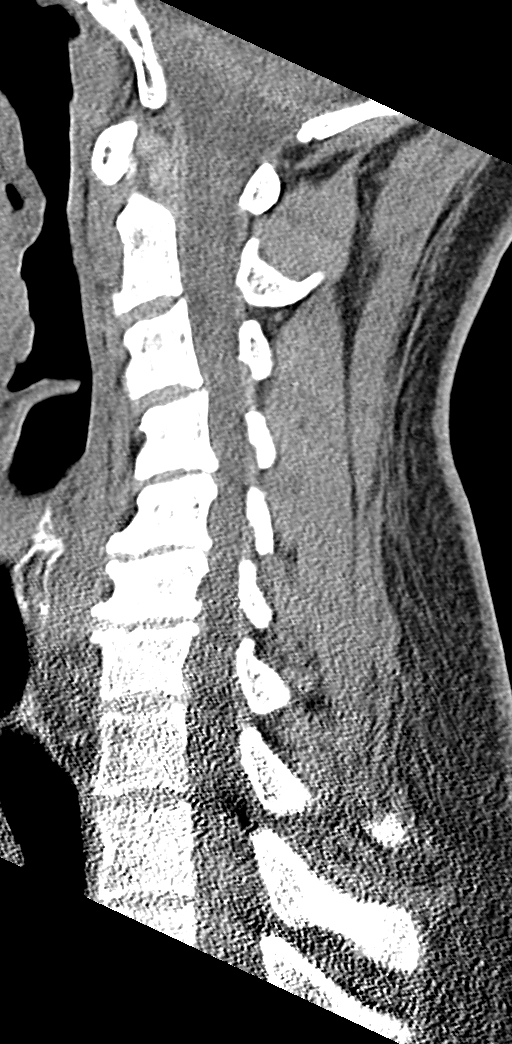
[im 39/78  bone]
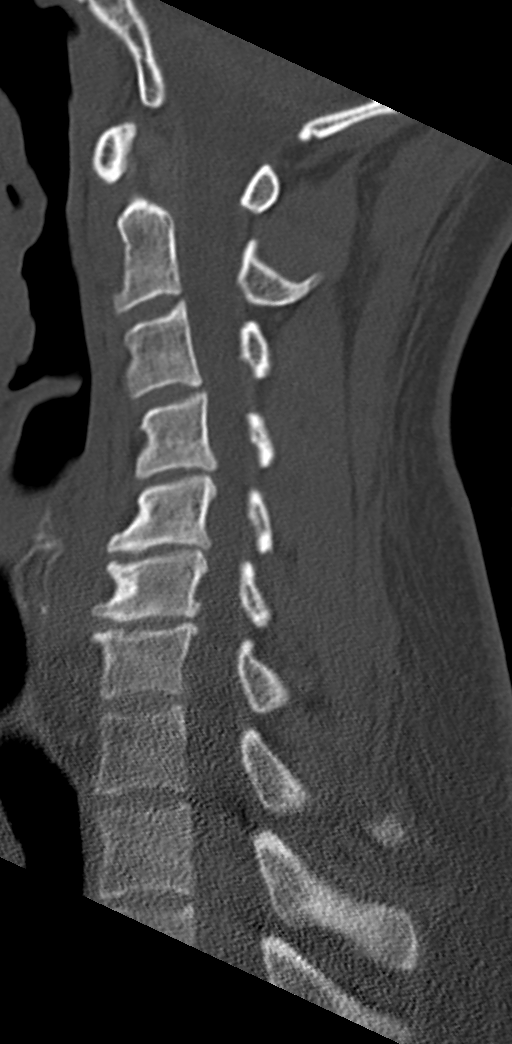
[im 45/78  bone]
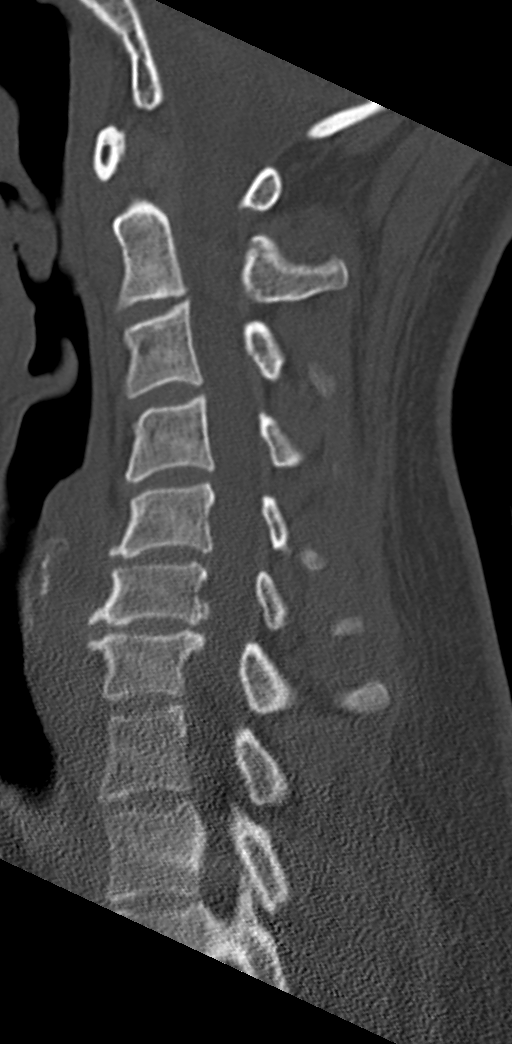
[im 52/78  bone]
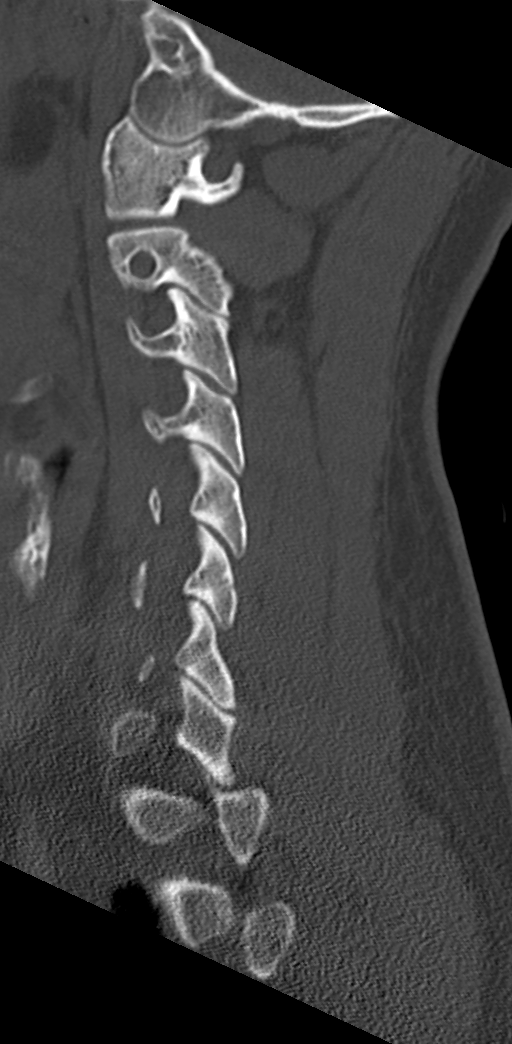

[Series 7: orthogonal axials · axial · 0.21mm/px · z∈[+825,+871]mm · 2 of 89 slices shown, 3 images]
[im 30/89  soft-tissue]
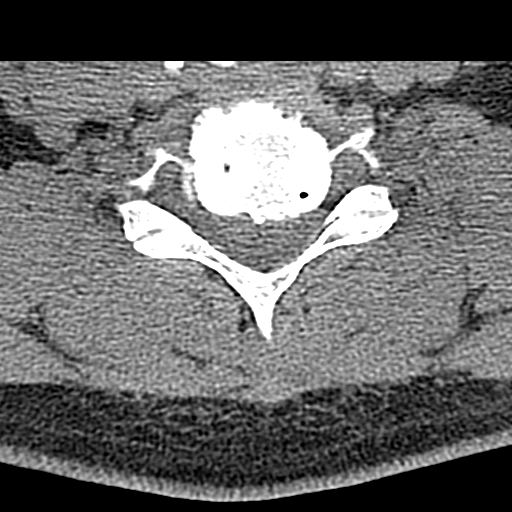
[im 30/89  bone]
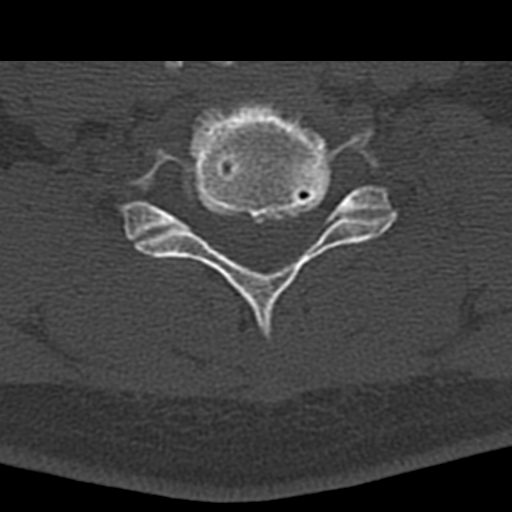
[im 59/89  bone]
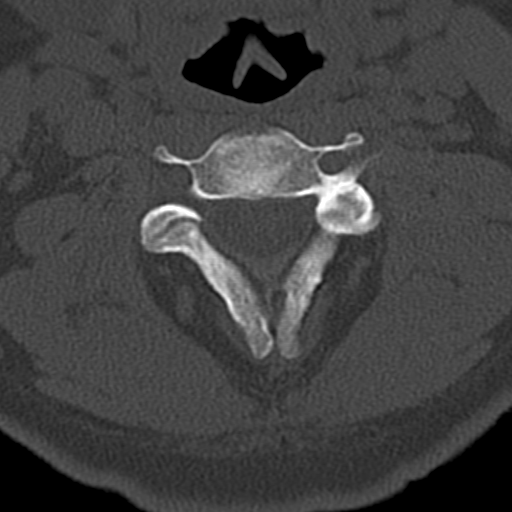

[10 of 33 positions shown; findings below may reference images not displayed]

FINDINGS: Alignment: Reversal of normal lordosis centered at C5-C6. No
listhesis.

Skull base and vertebrae: No acute fracture. No primary bone lesion
or focal pathologic process.

Soft tissues and spinal canal: No prevertebral fluid or swelling. No
visible canal hematoma.

Disc levels: Disc space narrowing and spurring at C5-C6 and C6-C7.
Small posterior osteophytes at C6 level causes mild mass effect the
spinal canal. There is no high-grade bony neural foraminal
narrowing.

Upper chest: No acute or unexpected findings.

Other: None.
IMPRESSION: Mild degenerative disc disease at C5-C6 and C6-C7. Small posterior
osteophytes at C6 level causes mild mass effect the spinal canal. No
high-grade bony neural foraminal narrowing.

## 2023-04-25 ENCOUNTER — Ambulatory Visit (INDEPENDENT_AMBULATORY_CARE_PROVIDER_SITE_OTHER): Payer: Medicaid Other | Admitting: Family

## 2023-04-25 ENCOUNTER — Encounter: Payer: Self-pay | Admitting: Family

## 2023-04-25 ENCOUNTER — Other Ambulatory Visit (HOSPITAL_COMMUNITY)
Admission: RE | Admit: 2023-04-25 | Discharge: 2023-04-25 | Disposition: A | Discharge status: Home / Self Care | Source: Ambulatory Visit | Attending: Family | Admitting: Family

## 2023-04-25 VITALS — BP 126/81 | HR 73 | Temp 97.2°F | Ht 68.0 in | Wt 170.0 lb

## 2023-04-25 DIAGNOSIS — Z13220 Encounter for screening for lipoid disorders: Secondary | ICD-10-CM

## 2023-04-25 DIAGNOSIS — Z0000 Encounter for general adult medical examination without abnormal findings: Secondary | ICD-10-CM

## 2023-04-25 DIAGNOSIS — Z1159 Encounter for screening for other viral diseases: Secondary | ICD-10-CM | POA: Diagnosis not present

## 2023-04-25 DIAGNOSIS — J309 Allergic rhinitis, unspecified: Secondary | ICD-10-CM | POA: Diagnosis not present

## 2023-04-25 DIAGNOSIS — Z113 Encounter for screening for infections with a predominantly sexual mode of transmission: Secondary | ICD-10-CM | POA: Diagnosis not present

## 2023-04-25 DIAGNOSIS — Z114 Encounter for screening for human immunodeficiency virus [HIV]: Secondary | ICD-10-CM | POA: Diagnosis not present

## 2023-04-25 DIAGNOSIS — Z8739 Personal history of other diseases of the musculoskeletal system and connective tissue: Secondary | ICD-10-CM | POA: Insufficient documentation

## 2023-04-25 DIAGNOSIS — Z1211 Encounter for screening for malignant neoplasm of colon: Secondary | ICD-10-CM

## 2023-04-25 LAB — COMPREHENSIVE METABOLIC PANEL
ALT: 30 U/L (ref 0–53)
AST: 23 U/L (ref 0–37)
Albumin: 4.6 g/dL (ref 3.5–5.2)
Alkaline Phosphatase: 70 U/L (ref 39–117)
BUN: 13 mg/dL (ref 6–23)
CO2: 30 meq/L (ref 19–32)
Calcium: 9.9 mg/dL (ref 8.4–10.5)
Chloride: 103 meq/L (ref 96–112)
Creatinine, Ser: 0.91 mg/dL (ref 0.40–1.50)
GFR: 101.71 mL/min (ref >60.00)
Glucose, Bld: 97 mg/dL (ref 70–99)
Potassium: 5 meq/L (ref 3.5–5.1)
Sodium: 139 meq/L (ref 135–145)
Total Bilirubin: 0.8 mg/dL (ref 0.2–1.2)
Total Protein: 7.6 g/dL (ref 6.0–8.3)

## 2023-04-25 LAB — CBC WITH DIFFERENTIAL/PLATELET
Basophils Absolute: 0 10*3/uL (ref 0.0–0.1)
Basophils Relative: 0.6 % (ref 0.0–3.0)
Eosinophils Absolute: 0.1 10*3/uL (ref 0.0–0.7)
Eosinophils Relative: 1.2 % (ref 0.0–5.0)
HCT: 44.1 % (ref 39.0–52.0)
Hemoglobin: 14.7 g/dL (ref 13.0–17.0)
Lymphocytes Relative: 16.8 % (ref 12.0–46.0)
Lymphs Abs: 1 10*3/uL (ref 0.7–4.0)
MCHC: 33.2 g/dL (ref 30.0–36.0)
MCV: 89.8 fl (ref 78.0–100.0)
Monocytes Absolute: 0.5 10*3/uL (ref 0.1–1.0)
Monocytes Relative: 9.2 % (ref 3.0–12.0)
Neutro Abs: 4.2 10*3/uL (ref 1.4–7.7)
Neutrophils Relative %: 72.2 % (ref 43.0–77.0)
Platelets: 362 10*3/uL (ref 150.0–400.0)
RBC: 4.91 Mil/uL (ref 4.22–5.81)
RDW: 13.2 % (ref 11.5–15.5)
WBC: 5.8 10*3/uL (ref 4.0–10.5)

## 2023-04-25 LAB — LIPID PANEL
Cholesterol: 183 mg/dL (ref 0–200)
HDL: 39 mg/dL — ABNORMAL LOW (ref >39.00)
LDL Cholesterol: 112 mg/dL — ABNORMAL HIGH (ref 0–99)
NonHDL: 143.73
Total CHOL/HDL Ratio: 5
Triglycerides: 157 mg/dL — ABNORMAL HIGH (ref 0.0–149.0)
VLDL: 31.4 mg/dL (ref 0.0–40.0)

## 2023-04-25 LAB — TSH: TSH: 1.45 u[IU]/mL (ref 0.35–5.50)

## 2023-04-25 NOTE — Progress Notes (Signed)
 Phone: [phone]  Subjective:  Patient 48 y.o. male presenting for annual physical.  Chief Complaint  Patient presents with   New Patient (Initial Visit)   Annual Exam    Fasting w/ labs    Discussed the use of AI scribe software for clinical note transcription with the patient, who gave verbal consent to proceed.  History of Present Illness   The patient, with a known history of ankylosing spondylitis, presents for a routine physical examination and to establish care. He has been experiencing periodic pain and swelling in his ankles, which he attributes to his arthritis. He has been managing his allergies with Alavert, which he takes daily. He is concerned about his age-related health risks and is interested in scheduling a colonoscopy, as he has never had one before. He is also aware of his family history of Parkinson's disease and heart issues, which adds to his health concerns. Despite his health issues, the patient maintains an active lifestyle, participating in sports and other physical activities daily.     See problem oriented charting- ROS- full  review of systems was completed and negative.  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   Vision abnormalities    Visit for preventive health examination 05/26/2016   Patient Active Problem List   Diagnosis Date Noted   Multiple joint pain 06/16/2016   Bilateral ankle pain 06/16/2016   Chronic right shoulder pain 05/31/2016   Osteoarthritis of spine 05/31/2016   Positive ANA (antinuclear antibody) 05/31/2016   Past Surgical History:  Procedure Laterality Date   NO PAST SURGERIES     WISDOM TOOTH EXTRACTION      Family History  Problem Relation Age of Onset   Hypertension Mother    Hyperlipidemia  Mother    Hyperlipidemia Father    Hypertension Father    Heart disease Father        Triple Bypass   Healthy Sister    Hyperlipidemia Brother    COPD Maternal Uncle    Hypertension Maternal Uncle    Atrial fibrillation Maternal Uncle    Hypertension Paternal Uncle    Hyperlipidemia Paternal Uncle    Hypertension Maternal Grandmother    Cancer Maternal Grandmother        Uterus   Dementia Maternal Grandmother    Hypertension Maternal Grandfather    Cancer Maternal Grandfather        Prostate, Lung, Brain   Cancer Paternal Grandmother        Breast   Heart attack Paternal Grandmother    Cancer Paternal Grandfather        Leukemia   Medications- reviewed and updated No current outpatient medications on file.   No current facility-administered medications for this visit.   Allergies-reviewed and updated No Known Allergies  Social History   Social History Narrative   Not on file    Objective:  BP 126/81 (BP Location: Left Arm, Patient Position: Sitting, Cuff Size: Large)   Pulse 73   Temp (!) 97.2 F (36.2 C) (Temporal)   Ht 5\' 8"  (1.727 m)   Wt 170 lb (77.1 kg)   SpO2 99%   BMI 25.85 kg/m  Physical Exam Vitals and nursing note reviewed.  Constitutional:      General: He is not in acute distress.  Appearance: Normal appearance.  HENT:     Head: Normocephalic.     Right Ear: Tympanic membrane and external ear normal.     Left Ear: Tympanic membrane and external ear normal.     Nose: Nose normal.     Mouth/Throat:     Mouth: Mucous membranes are moist.  Eyes:     Extraocular Movements: Extraocular movements intact.  Cardiovascular:     Rate and Rhythm: Normal rate and regular rhythm.  Pulmonary:     Effort: Pulmonary effort is normal.     Breath sounds: Normal breath sounds.  Abdominal:     General: Abdomen is flat. There is no distension.     Palpations: Abdomen is soft.     Tenderness: There is no abdominal tenderness.  Musculoskeletal:         General: Normal range of motion.     Cervical back: Normal range of motion.  Skin:    General: Skin is warm and dry.  Neurological:     Mental Status: He is alert and oriented to person, place, and time.  Psychiatric:        Mood and Affect: Mood normal.        Behavior: Behavior normal.        Judgment: Judgment normal.     Assessment and Plan   Health Maintenance counseling: 1. Anticipatory guidance: Patient counseled regarding regular dental exams q6 months, eye exams yearly, avoiding smoking and second hand smoke, limiting alcohol to 2 beverages per day.   2. Risk factor reduction:  Advised patient of need for regular exercise and diet rich in fruits and vegetables to reduce risk of heart attack and stroke. Exercise- plays variety of sports, active most days.   Wt Readings from Last 3 Encounters:  04/25/23 170 lb (77.1 kg)  05/06/22 166 lb (75.3 kg)  05/24/21 173 lb 15.1 oz (78.9 kg)   3. Immunizations/screenings/ancillary studies Immunization History  Administered Date(s) Administered   Moderna Sars-Covid-2 Vaccination 02/10/2020, 03/30/2020   Health Maintenance Due  Topic Date Due   HIV Screening  Never done    4. Prostate cancer screening >55yo - risk factors? none 5. Colon cancer screening: never, ordering today 6. Skin cancer screening-  advised regular sunscreen use. Denies worrisome, changing, or new skin lesions. Pt established with Dermatology. 7. Smoking associated screening (lung cancer screening, AAA screen 65-75, UA)- non- smoker 8. STD screening - N/A 9. Alcohol screening: none  Assessment and Plan    Ankylosing Spondylitis - Managed with physical activity and OTC anti-inflammatory medications. - Continue regular physical activity. - Use OTC anti-inflammatory medications as needed for pain management.  Allergic Rhinitis - Chronic nasal congestion and itchiness managed with Alavert. Discussed additional relief options with OTC nasal sprays. - Continue  Alavert for allergy management. - Recommend OTC Nasacort or Flonase nasal spray for additional relief if needed.  General Health Maintenance - Routine exam showed no hypertension or significant skin issues. Discussed colonoscopy referral process and sedation effects. - Order full lab panel including cholesterol, metabolic panel, liver function, kidney function,  electrolytes, blood count, HIV, hepatitis C, and thyroid function tests. - Refer for colonoscopy consultation. - Advise on maintaining hydration with at least two liters of water daily. - Encourage use of sun protection, especially during outdoor activities like golfing. - Encourage regular dental check-ups and flossing. - Encourage use of MyChart for accessing medical information and scheduling appointments.      Recommended follow up:  Return for any future  concerns, Complete physical w/fasting labs. No future appointments.   Lab/Order associations:fasting    Dulce Sellar, NP

## 2023-04-25 NOTE — Patient Instructions (Addendum)
 Welcome to [facility], It was a pleasure meeting you today!   I will review your lab results via MyChart in a few days.  I have sent a referral to gastroenterology for your Colonoscopy.  Stay well, get outside and enjoy the Spring season!    PLEASE NOTE: If you had any LAB tests please let us know if you have not heard back within a few days. You may see your results on MyChart before we have a chance to review them but we will give you a call once they are reviewed by [place]. If we ordered any REFERRALS today, please let us know if you have not heard from their office within the next week.  Let us know through MyChart if you are needing REFILLS, or have your pharmacy send [place] the request. You can also use MyChart to communicate with me or any office staff.  Please try these tips to maintain a healthy lifestyle: It is important that you exercise regularly at least 30 minutes 5 times a week. Think about what you will eat, plan ahead. Choose whole foods, & think  "clean, green, fresh or frozen" over canned, processed or packaged foods which are more sugary, salty, and fatty. 70 to 75% of food eaten should be fresh vegetables and protein. 2-3  meals daily with healthy snacks between meals, but must be whole fruit, protein or vegetables. Aim to eat over a 10 hour period when you are active, for example, 7am to 5pm, and then STOP after your last meal of the day, drinking only water.  Shorter eating windows, 6-8 hours, are showing benefits in heart disease and blood sugar regulation. Drink water every day! Shoot for 64 ounces daily = 8 cups, no other drink is as healthy! Fruit juice is best enjoyed in a healthy way, by EATING the fruit.

## 2023-04-25 NOTE — Progress Notes (Deleted)
   New Patient Office Visit  Subjective:  Patient ID: Zachary Garner, male    DOB: 01-30-1976  Age: 48 y.o. MRN: [id number]  CC: No chief complaint on file.   HPI Zachary Garner presents for establishing care today.    Assessment & Plan:  There are no diagnoses linked to this encounter.  Subjective:    Outpatient Medications Prior to Visit  Medication Sig Dispense Refill   Naproxen-Esomeprazole (VIMOVO) 500-20 MG TBEC Take 1 tablet by mouth 2 (two) times daily.     predniSONE (STERAPRED UNI-PAK 21 TAB) 10 MG (21) TBPK tablet Take by mouth daily. Take 6 tabs by mouth daily  for 2 days, then 5 tabs for 2 days, then 4 tabs for 2 days, then 3 tabs for 2 days, 2 tabs for 2 days, then 1 tab by mouth daily for 2 days 42 tablet 0   No facility-administered medications prior to visit.   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  Physical Exam Vitals and nursing note reviewed.  Constitutional:      General: He is not in acute distress.    Appearance: Normal appearance.  HENT:     Head: Normocephalic.  Cardiovascular:     Rate and Rhythm: Normal rate and regular rhythm.  Pulmonary:     Effort: Pulmonary effort is normal.     Breath sounds: Normal breath sounds.  Musculoskeletal:        General: Normal range of motion.     Cervical back: Normal range of motion.  Skin:    General: Skin is warm and dry.  Neurological:     Mental Status: He is alert and oriented to person, place, and time.  Psychiatric:        Mood and Affect: Mood normal.     No orders of the defined types were placed  in this encounter.   Dulce Sellar, NP

## 2023-04-26 LAB — URINE CYTOLOGY ANCILLARY ONLY
Chlamydia: NEGATIVE
Comment: NEGATIVE
Comment: NEGATIVE
Comment: NORMAL
Neisseria Gonorrhea: NEGATIVE
Trichomonas: NEGATIVE

## 2023-04-26 LAB — HEPATITIS C ANTIBODY: Hepatitis C Ab: NONREACTIVE

## 2023-04-26 LAB — HIV ANTIBODY (ROUTINE TESTING W REFLEX): HIV 1&2 Ab, 4th Generation: NONREACTIVE

## 2023-04-27 ENCOUNTER — Encounter: Payer: Self-pay | Admitting: Family

## 2023-12-06 DIAGNOSIS — F4323 Adjustment disorder with mixed anxiety and depressed mood: Secondary | ICD-10-CM | POA: Diagnosis not present

## 2023-12-25 DIAGNOSIS — F4323 Adjustment disorder with mixed anxiety and depressed mood: Secondary | ICD-10-CM | POA: Diagnosis not present

## 2024-04-25 ENCOUNTER — Encounter: Admitting: Family
